# Patient Record
Sex: Male | Born: 1955
Health system: Southern US, Community
[De-identification: ages and names within clinical notes are randomized; demographics above are authoritative.]

## PROBLEM LIST (undated history)

## (undated) DIAGNOSIS — J449 Chronic obstructive pulmonary disease, unspecified: Secondary | ICD-10-CM

## (undated) DIAGNOSIS — I639 Cerebral infarction, unspecified: Secondary | ICD-10-CM

## (undated) DIAGNOSIS — J45909 Unspecified asthma, uncomplicated: Secondary | ICD-10-CM

## (undated) DIAGNOSIS — M1990 Unspecified osteoarthritis, unspecified site: Secondary | ICD-10-CM

## (undated) DIAGNOSIS — F32A Depression, unspecified: Secondary | ICD-10-CM

## (undated) DIAGNOSIS — J189 Pneumonia, unspecified organism: Secondary | ICD-10-CM

## (undated) DIAGNOSIS — F419 Anxiety disorder, unspecified: Secondary | ICD-10-CM

## (undated) DIAGNOSIS — I10 Essential (primary) hypertension: Secondary | ICD-10-CM

## (undated) DIAGNOSIS — E785 Hyperlipidemia, unspecified: Secondary | ICD-10-CM

## (undated) DIAGNOSIS — G4700 Insomnia, unspecified: Secondary | ICD-10-CM

## (undated) DIAGNOSIS — C801 Malignant (primary) neoplasm, unspecified: Secondary | ICD-10-CM

## (undated) HISTORY — PX: APPENDECTOMY: SHX54

## (undated) HISTORY — DX: Chronic obstructive pulmonary disease, unspecified: J44.9

## (undated) HISTORY — DX: Hyperlipidemia, unspecified: E78.5

## (undated) HISTORY — DX: Cerebral infarction, unspecified: I63.9

## (undated) HISTORY — DX: Insomnia, unspecified: G47.00

## (undated) HISTORY — DX: Unspecified asthma, uncomplicated: J45.909

## (undated) HISTORY — PX: EYE SURGERY: SHX253

## (undated) HISTORY — DX: Essential (primary) hypertension: I10

## (undated) HISTORY — DX: Unspecified osteoarthritis, unspecified site: M19.90

## (undated) HISTORY — PX: PROSTATE SURGERY: SHX751

---

## 2000-04-02 HISTORY — PX: MOHS SURGERY: SUR867

## 2006-08-09 ENCOUNTER — Encounter: Payer: Self-pay | Admitting: Internal Medicine

## 2010-04-27 ENCOUNTER — Ambulatory Visit: Admit: 2010-04-27 | Payer: Self-pay | Admitting: Internal Medicine

## 2010-04-28 ENCOUNTER — Ambulatory Visit
Admission: RE | Admit: 2010-04-28 | Discharge: 2010-04-28 | Payer: Self-pay | Source: Home / Self Care | Attending: Internal Medicine | Admitting: Internal Medicine

## 2010-04-28 ENCOUNTER — Encounter: Payer: Self-pay | Admitting: Internal Medicine

## 2010-04-28 DIAGNOSIS — J449 Chronic obstructive pulmonary disease, unspecified: Secondary | ICD-10-CM | POA: Insufficient documentation

## 2010-04-28 DIAGNOSIS — C801 Malignant (primary) neoplasm, unspecified: Secondary | ICD-10-CM | POA: Insufficient documentation

## 2010-04-28 DIAGNOSIS — G4733 Obstructive sleep apnea (adult) (pediatric): Secondary | ICD-10-CM | POA: Insufficient documentation

## 2010-04-28 DIAGNOSIS — I10 Essential (primary) hypertension: Secondary | ICD-10-CM | POA: Insufficient documentation

## 2010-04-28 DIAGNOSIS — F17210 Nicotine dependence, cigarettes, uncomplicated: Secondary | ICD-10-CM | POA: Insufficient documentation

## 2010-05-04 NOTE — Miscellaneous (Signed)
Summary: Orders Update pft charges   Clinical Lists Changes  Orders: Added new Service order of Carbon Monoxide diffusing w/capacity (94729) - Signed Added new Service order of Lung Volumes/Gas dilution or washout (94727) - Signed Added new Service order of Spirometry (Pre & Post) (94060) - Signed 

## 2010-05-04 NOTE — Assessment & Plan Note (Signed)
Summary: Pulmonary consultation/ copd eval    Visit Type:  Initial Consult Copy to:  [facility] Primary Provider/Referring Provider:  Dr. Kirstie Peri  CC:  DOE.  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 has noct wheeze disturbs sleep.  can't tell if dulera helpiing at this point not really using it. no purulent sputum.  Pt denies any significant sore throat, dysphagia, itching, sneezing,  nasal congestion or excess secretions,  fever, chills, sweats, unintended wt loss, pleuritic or exertional cp, hempoptysis, variability in activity tolerance  orthopnea pnd or leg swelling. Pt also denies any obvious fluctuation in symptoms with weather or environmental change or other alleviating or aggravating factors.       Current Medications (verified): 1)  Spironolactone 25 Mg Tabs (Spironolactone) .Marland Kitchen.. 1 Two Times A Day 2)  Dulera 100-5 Mcg/act Aero (Mometasone Furo-Formoterol Fum) .Marland Kitchen.. 1 Puff Two Times A Day 3)  Metoprolol Succinate 25 Mg Xr24h-Tab (Metoprolol Succinate) .Marland Kitchen.. 1 Two Times A Day 4)  Hydrochlorothiazide 25 Mg Tabs (Hydrochlorothiazide) .Marland Kitchen.. 1 Once Daily 5)  Micardis 40 Mg Tabs (Telmisartan) .Marland Kitchen.. 1 Once Daily 6)  Klor-Con 20 Meq Pack (Potassium Chloride) .Marland Kitchen.. 1 Once Daily 7)  Alprazolam 1 Mg Tabs (Alprazolam) .Marland Kitchen.. 1 At Bedtime 8)  Alprazolam 0.5 Mg Tabs (Alprazolam) .... 1/4 Once Daily As Needed 9)  Ambien 10 Mg Tabs (Zolpidem Tartrate) .Marland Kitchen.. 1 At Bedtime 10)  Aspirin 81 Mg Tbec (Aspirin) .Marland Kitchen.. 1 Once Daily  Allergies (verified): No Known Drug Allergies  Past History:  Past Medical History: COPD    - HFA 75% April 28, 2010     - PFT's April 28, 2010  FEV1 1.03 (33%) ratio 45 and no better p B2,  DLC0 65%  Past Surgical History: Rt lower lobectomy surgery age 55  Family History: Heart dz- Father and Brother Prostate CA- Maternal Uncle Pancreatic CA- Paternal Uncle Lung CA- Paternal Aunt  Social History: Moved from [place] to [place], [place] in late 2010 Single Children Current smoker since age 40. Smoked aprox 1/2 ppd. No ETOH Disabled  Review of Systems       The patient complains of shortness of breath with activity, shortness of breath at rest, non-productive cough, irregular heartbeats, indigestion, tooth/dental problems, and anxiety.  The patient denies productive cough, coughing up blood, chest pain, acid heartburn, loss of appetite, weight change, abdominal pain, difficulty swallowing, sore throat, headaches, nasal congestion/difficulty breathing through nose, sneezing, itching, ear ache, depression, hand/feet swelling, joint stiffness or pain, rash, change in color of mucus, and fever.    Vital Signs:  Patient profile:   55 year old male Height:      66 inches Weight:      200.50 pounds BMI:     32.48 O2 Sat:      95 % on Room air Temp:     98.5 degrees F oral Pulse rate:   76 / minute BP sitting:   118 / 68  (left arm)  Vitals Entered By: Vernie Murders (April 28, 2010 10:19 AM)  O2 Flow:  Room air  Physical Exam  Additional Exam:  Pleasant but very anxious amb bm nad wt 200 HEENT mild turbinate edema.  Oropharynx no thrush or excess pnd or cobblestoning.  No JVD or cervical adenopathy. Mild accessory muscle hypertrophy. Trachea midline, nl thryroid. Chest was hyperinflated by percussion with diminished breath sounds and moderate increased exp time without wheeze. Hoover sign positive at mid inspiration. Regular rate and rhythm without murmur gallop or rub or increase P2 or edema.  Abd: no hsm, quite obese, limited excursion in supine position. Ext warm without cyanosis or clubbing.     CXR  Procedure date:   04/28/2010  Findings:        Comparison: None.   Findings: Surgical clips are noted in the right chest.  There is blunting of the right costophrenic angle which could be due to scar or small effusion.  No left pleural effusion.  Lungs are clear with pulmonary hyperexpansion noted.  Heart size is normal.   IMPRESSION:   1.  Emphysema without acute disease. 2.  Postoperative change with scar versus a very small right pleural effusion.  Impression & Recommendations:  Problem # 1:  COPD (ICD-496) GOLD III/IV severe copd with no obvious reversible component and proportionate symptoms of cough and sob    DDX of  difficult airways managment all start with A and  include Adherence, Ace Inhibitors, Acid Reflux, Active Sinus Disease, Alpha 1 Antitripsin deficiency, Anxiety masquerading as Airways dz,  ABPA,  allergy(esp in young), Aspiration (esp in elderly), Adverse effects of DPI,  Active smokers, plus two Bs  = Bronchiectasis and Beta blocker use..and one C= CHF   Adherence:  I spent extra time with the patient today explaining optimal mdi  technique.  This improved from  25-50% p coaching  Active smoking: see #2  He could be expected to do desk work and could work in an office if he could be transported w/in 200 ft of destination as long as no steps  Problem # 2:  SMOKER (ICD-305.1)  I emphasized that although we never turn away smokers from the pulmonary clinic, we do ask that they understand that the recommendations that were made won't work nearly as well in the presence of continued cigarette exposure and we may reach a point where we can't help the patient if he/she can't quit smoking.    I reviewed the Flethcher curve with her that basically says if he quits smoking now he is likely to maintain the same level of function he has now but there was no medication on the market that has proven to change the curve or the likelihood of progression. Stopping smoking is more important in  other words than choice of inhalers or doctors  Medications Added to Medication List This Visit: 1)  Spironolactone 25 Mg Tabs (Spironolactone) .Marland Kitchen.. 1 two times a day 2)  Dulera 100-5 Mcg/act Aero (Mometasone furo-formoterol fum) .Marland Kitchen.. 1 puff two times a day 3)  Metoprolol Succinate 25 Mg Xr24h-tab (Metoprolol succinate) .Marland Kitchen.. 1 two times a day 4)  Hydrochlorothiazide 25 Mg Tabs (Hydrochlorothiazide) .Marland Kitchen.. 1 once daily 5)  Micardis 40 Mg Tabs (Telmisartan) .Marland Kitchen.. 1 once daily 6)  Klor-con 20 Meq Pack (Potassium chloride) .Marland Kitchen.. 1 once daily 7)  Alprazolam 1 Mg Tabs (Alprazolam) .Marland Kitchen.. 1 at bedtime 8)  Alprazolam 0.5 Mg Tabs (Alprazolam) .... 1/4 once daily as needed 9)  Ambien 10 Mg Tabs (Zolpidem tartrate) .Marland Kitchen.. 1 at bedtime 10)  Aspirin 81 Mg Tbec (Aspirin) .Marland Kitchen.. 1 once daily  Other Orders: T-2 View CXR (71020TC)  Consultation Level V [phone])  Patient Instructions: 1)  Try taking the dulera  2 puffs first thing  in am and 2 puffs again in pm about 12 hours later  2)  Stopping smoking is the only treatment option that will preserve your lung function at it's present level 3)  Copy sent to: vocational rehab

## 2010-05-18 NOTE — Letter (Signed)
Summary: [facility]  [place]  [facility]  [place]   Imported By: Sean Ramos 05/08/2010 10:09:43  _____________________________________________________________________  External Attachment:    Type:   Image     Comment:   External Document

## 2010-05-24 ENCOUNTER — Telehealth (INDEPENDENT_AMBULATORY_CARE_PROVIDER_SITE_OTHER): Payer: Self-pay | Admitting: *Deleted

## 2010-05-30 NOTE — Progress Notes (Signed)
  Phone Note Other Incoming   Request: Send information Summary of Call: Request for records received from  [facility]. Request forwarded to [employer].  04/04/2007 to 05/22/2010.

## 2014-12-22 ENCOUNTER — Ambulatory Visit: Payer: Self-pay | Admitting: Urology

## 2015-08-31 ENCOUNTER — Ambulatory Visit (INDEPENDENT_AMBULATORY_CARE_PROVIDER_SITE_OTHER): Payer: Medicare Other | Admitting: Internal Medicine

## 2015-08-31 ENCOUNTER — Ambulatory Visit (INDEPENDENT_AMBULATORY_CARE_PROVIDER_SITE_OTHER)
Admission: RE | Admit: 2015-08-31 | Discharge: 2015-08-31 | Disposition: A | Discharge status: Home / Self Care | Payer: Medicare Other | Source: Ambulatory Visit | Attending: Internal Medicine | Admitting: Internal Medicine

## 2015-08-31 ENCOUNTER — Encounter: Payer: Self-pay | Admitting: Internal Medicine

## 2015-08-31 VITALS — BP 102/70 | HR 107 | Ht 66.5 in | Wt 197.0 lb

## 2015-08-31 DIAGNOSIS — F17210 Nicotine dependence, cigarettes, uncomplicated: Secondary | ICD-10-CM | POA: Diagnosis not present

## 2015-08-31 DIAGNOSIS — I10 Essential (primary) hypertension: Secondary | ICD-10-CM | POA: Diagnosis not present

## 2015-08-31 DIAGNOSIS — Z720 Tobacco use: Secondary | ICD-10-CM

## 2015-08-31 DIAGNOSIS — J449 Chronic obstructive pulmonary disease, unspecified: Secondary | ICD-10-CM

## 2015-08-31 MED ORDER — TIOTROPIUM BROMIDE MONOHYDRATE 2.5 MCG/ACT IN AERS: INHALATION_SPRAY | RESPIRATORY_TRACT | Status: DC

## 2015-08-31 NOTE — Patient Instructions (Addendum)
Plan A = Automatic = Symbicort 160 Take 2 puffs first thing in am and then another 2 puffs about 12 hours later.                                      Spiriva 2 pffs each am (like high octane)   Plan B = Backup Only use your albuterol (proair) as a rescue medication to be used if you can't catch your breath by resting or doing a relaxed purse lip breathing pattern.  - The less you use it, the better it will work when you need it. - Ok to use the inhaler up to 2 puffs  every 4 hours if you must but call for appointment if use goes up over your usual need - Don't leave home without it !!  (think of it like the spare tire for your car)   Plan C = Crisis - only use your albuterol nebulizer if you first try Plan B and it fails to help > ok to use the nebulizer up to every 4 hours but if start needing it regularly call for immediate appointment   The key is to stop smoking completely before smoking completely stops you!  Think of the e cigs as a one way bridge off all tobacco products  Please schedule a follow up office visit in 4 weeks, call sooner if needed with pfts to be done asap at [facility]

## 2015-08-31 NOTE — Progress Notes (Signed)
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 Age 60 ? Dx ? And reports  flunked pfts for job around 2000 and on disability and since then  dx as copd in [place] 2011 and seen in pulmonary clinic 04/28/10 and referred back by Dr Anthonette Legato 08/31/2015    History of Present Illness  April 28, 2010 1st pulmonary office eval ov cc doe and cough onset in 40's and progressed to point where doe shopping leans on cart and struggles to get groceries back to his house (lives next to store). has noct wheeze disturbs sleep. can't tell if dulera helpiing at this point not really using it. Dx gold III/IV copd  rec dulera 100 2bid / prn saba  Stop smoking   08/31/2015 1st EPIC  [place] [facility] office visit//  Krissie Merrick  On symbicort 160 poor hfa  Chief Complaint  Patient presents with  . Pulmonary Consult    Referred by Dr. Manuella Ghazi. Pt seen here in the past- last in 2012. He states that his breathing has been worse over the past few months. He states that he gets SOB walking "not far" and also when he gets upset about something.   wakes up coughing/ congested each am/bad days  can't walk across house s sob/other days does ok very slow to mailbox slt up hill back to house. Sob at rest when gets upset / some better with saba hfa and neb   No obvious day to day or daytime variability or assoc excess/ purulent sputum or mucus plugs or hemoptysis or cp or chest tightness, subjective wheeze or overt sinus or hb symptoms. No unusual exp hx or h/o childhood pna/ asthma or knowledge of premature birth.  Sleeping ok without nocturnal  or early am exacerbation  of respiratory  c/o's or need for noct saba. Also denies any obvious fluctuation of symptoms with weather or environmental changes or other aggravating or alleviating factors except as outlined above   Current Medications, Allergies, Complete Past  Medical History, Past Surgical History, Family History, and Social History were reviewed in [employer] record.  ROS  The following are not active complaints unless bolded sore throat, dysphagia, dental problems, itching, sneezing,  nasal congestion or excess/ purulent secretions, ear ache,   fever, chills, sweats, unintended wt loss, classically pleuritic or exertional cp,  orthopnea pnd or leg swelling, presyncope, palpitations, abdominal pain, anorexia, nausea, vomiting, diarrhea  or change in bowel or bladder habits, change in stools or urine, dysuria,hematuria,  rash, arthralgias, visual complaints, headache, numbness, weakness or ataxia or problems with walking or coordination,  change in mood/affect or memory.           Review of Systems     Objective:   Physical Exam    obese amb wm congested cough  Wt Readings from Last 3 Encounters:  08/31/15 197 lb (89.359 kg)  04/28/10 200 lb 8 oz (90.946 kg)    Vital signs reviewed    HEENT:  Top dentures/ nl  turbinates, and oropharynx. Nl external ear canals without cough reflex   NECK :  without JVD/Nodes/TM/ nl carotid upstrokes bilaterally   LUNGS: no acc muscle use,  Nl contour chest with mid exp bilateral sonorous rhonchi  CV:  RRR  no s3 or murmur or increase in P2, no edema  ABD:  soft and nontender with nl inspiratory excursion in the supine position. No bruits or organomegaly, bowel sounds nl  MS:  Nl gait/ ext warm without deformities, calf tenderness, cyanosis or clubbing No obvious joint restrictions   SKIN: warm and dry without lesions    NEURO:  alert, approp, nl sensorium with  no motor deficits    CXR PA and Lateral:   08/31/2015 :    I personally reviewed images and agree with radiology impression as follows:   1. Increasing small right pleural effusion with underlying atelectasis. Recommend follow-up to resolution. 2. Probable bilateral nipple shadows. Recommend nipple markers  on the follow-up chest x-ray.     Assessment:

## 2015-09-01 ENCOUNTER — Encounter: Payer: Self-pay | Admitting: Internal Medicine

## 2015-09-01 ENCOUNTER — Telehealth: Payer: Self-pay | Admitting: Internal Medicine

## 2015-09-01 NOTE — Assessment & Plan Note (Signed)
Procardia not an ideal choice for lung pts as potentially interferes with hypoxic pulmonary vasocontriction > rec alternative for longterm rx like norvasc or even minoxidil if needed

## 2015-09-01 NOTE — Assessment & Plan Note (Signed)
Complicated by HBP/ Hyperlipidemia  Body mass index is 31.32  No results found for: TSH   Contributing to gerd tendency/ doe/reviewed the need and the process to achieve and maintain neg calorie balance > defer f/u primary care including intermittently monitoring thyroid status

## 2015-09-01 NOTE — Assessment & Plan Note (Signed)

## 2015-09-01 NOTE — Assessment & Plan Note (Addendum)
PFT's April 28, 2010 FEV1 1.03 (33%) ratio 45 and no better p B2, DLC0 65% - 08/31/2015   Walked RA x one lap @ 185 stopped due to  Sob, nl pace, no desat   I doubt he's any better now than in 2012 but will need new set of pfts before filling out any disability paperwork and in meantime his symptoms appear difficult to control and he uses way too much saba daily  DDX of  difficult airways management almost all start with A and  include Adherence, Ace Inhibitors, Acid Reflux, Active Sinus Disease, Alpha 1 Antitripsin deficiency, Anxiety masquerading as Airways dz,  ABPA,  Allergy(esp in young), Aspiration (esp in elderly), Adverse effects of meds,  Active smokers, A bunch of PE's (a small clot burden can't cause this syndrome unless there is already severe underlying pulm or vascular dz with poor reserve) plus two Bs  = Bronchiectasis and Beta blocker use..and one C= CHF  Adherence is always the initial "prime suspect" and is a multilayered concern that requires a "trust but verify" approach in every patient - starting with knowing how to use medications, especially inhalers, correctly, keeping up with refills and understanding the fundamental difference between maintenance and prns vs those medications only taken for a very short course and then stopped and not refilled. In this case   Adherence is the biggest issue and starts with  inability to use HFA effectively and also  understand that SABA treats the symptoms but doesn't get to the underlying problem (inflammation).  I used  the analogy of putting steroid cream on a rash to help explain the meaning of topical therapy and the need to get the drug to the target tissue.   - The proper method of use, as well as anticipated side effects, of a metered-dose inhaler are discussed and demonstrated to the patient. Improved effectiveness after extensive coaching during this visit to a level of approximately 75 % from a baseline of 59 % try respimat spiriva  plus symbicort  ? Anxiety > usually at the bottom of this list of usual suspects but should be much higher on this pt's based on H and P and note already on psychotropics .    Active smoking the other obvious issue (see separate a/p)    Total time devoted to counseling  = 35/21m review case with pt/ discussion of options/alternatives/ personally creating written instructions  in presence of pt  then going over those specific  Instructions directly with the pt including how to use all of the meds but in particular covering each new medication in detail and the difference between the maintenance/automatic meds and the prns using an action plan format for the latter.  a

## 2015-09-01 NOTE — Progress Notes (Signed)
Quick Note:  LMTCB ______ 

## 2015-09-01 NOTE — Telephone Encounter (Signed)
Result Notes     Notes Recorded by Rosana Berger, CMA on 09/01/2015 at 9:14 AM LMTCB ------  Notes Recorded by Tanda Rockers, MD on 09/01/2015 at 8:21 AM Call pt: Reviewed cxr and no acute change vs prev studies / will need to repeat with nipple markers next ov to be sure what we're seeing is projected over the film from his breast tissue     Spoke with pt. He is aware of his results. Nothing further was needed.

## 2015-09-02 ENCOUNTER — Ambulatory Visit (HOSPITAL_COMMUNITY)
Admission: RE | Admit: 2015-09-02 | Discharge: 2015-09-02 | Disposition: A | Discharge status: Home / Self Care | Payer: Medicare Other | Source: Ambulatory Visit | Attending: Internal Medicine | Admitting: Internal Medicine

## 2015-09-02 ENCOUNTER — Telehealth: Payer: Self-pay | Admitting: Internal Medicine

## 2015-09-02 DIAGNOSIS — J449 Chronic obstructive pulmonary disease, unspecified: Secondary | ICD-10-CM | POA: Diagnosis present

## 2015-09-02 LAB — PULMONARY FUNCTION TEST
DL/VA % PRED: 81 %
DL/VA: 3.58 ml/min/mmHg/L
DLCO UNC % PRED: 38 %
DLCO UNC: 10.72 ml/min/mmHg
FEF 25-75 POST: 0.39 L/s
FEF 25-75 PRE: 0.25 L/s
FEF2575-%Change-Post: 55 %
FEF2575-%PRED-POST: 15 %
FEF2575-%PRED-PRE: 9 %
FEV1-%CHANGE-POST: 15 %
FEV1-%Pred-Post: 32 %
FEV1-%Pred-Pre: 28 %
FEV1-Post: 0.88 L
FEV1-Pre: 0.76 L
FEV1FVC-%Change-Post: 1 %
FEV1FVC-%PRED-PRE: 53 %
FEV6-%CHANGE-POST: 16 %
FEV6-%PRED-POST: 54 %
FEV6-%Pred-Pre: 46 %
FEV6-Post: 1.82 L
FEV6-Pre: 1.57 L
FEV6FVC-%CHANGE-POST: 2 %
FEV6FVC-%Pred-Post: 92 %
FEV6FVC-%Pred-Pre: 90 %
FVC-%Change-Post: 13 %
FVC-%PRED-POST: 59 %
FVC-%Pred-Pre: 52 %
FVC-Post: 2.06 L
FVC-Pre: 1.81 L
POST FEV1/FVC RATIO: 43 %
PRE FEV1/FVC RATIO: 42 %
PRE FEV6/FVC RATIO: 87 %
Post FEV6/FVC ratio: 88 %
RV % pred: 164 %
RV: 3.41 L
TLC % PRED: 83 %
TLC: 5.24 L

## 2015-09-02 MED ORDER — ALBUTEROL SULFATE (2.5 MG/3ML) 0.083% IN NEBU
2.5000 mg | INHALATION_SOLUTION | Freq: Once | RESPIRATORY_TRACT | Status: AC
  Administered 2015-09-02: 2.5 mg via RESPIRATORY_TRACT

## 2015-09-02 NOTE — Telephone Encounter (Signed)
Routing to [place] for follow up as she has documents.

## 2015-09-02 NOTE — Telephone Encounter (Signed)
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

## 2015-09-05 NOTE — Telephone Encounter (Signed)
Per Magda Paganini, all forms have been faxed to where they needed to go. Pt would like a copy of these mailed to him. He is aware that we will place them in the mail to him today. Nothing further was needed.

## 2015-09-05 NOTE — Telephone Encounter (Signed)
Pt calling to check the status of forms needs then by 6/8 and results of test please advise.Sean Ramos

## 2015-09-05 NOTE — Telephone Encounter (Signed)
The forms are filled out, except we need to fill in the date that he last worked  [facility] for the pt

## 2015-09-05 NOTE — Progress Notes (Signed)
Quick Note:  LMTCB ______ 

## 2015-09-05 NOTE — Telephone Encounter (Signed)
pfts show severe copd from smoking no change from last study

## 2015-09-05 NOTE — Telephone Encounter (Signed)
Will route message to [place] to update forms. I could not locate them above her desk.

## 2015-09-05 NOTE — Telephone Encounter (Addendum)
Will send to Magda Paganini as [employer] of date he last worked   Please advise Dr Melvyn Novas on PFT results. Thanks.

## 2015-09-05 NOTE — Telephone Encounter (Signed)
Patient called - he states last day worked was 04/21/2015. He would like a call back today as well with results of PFT. -prm

## 2015-09-06 ENCOUNTER — Telehealth: Payer: Self-pay | Admitting: Internal Medicine

## 2015-09-06 DIAGNOSIS — J449 Chronic obstructive pulmonary disease, unspecified: Secondary | ICD-10-CM

## 2015-09-06 NOTE — Telephone Encounter (Signed)
Patient states that [place] will not accept the Disability papers that was sent to them because they need an actual return to work date.  Patient states that the form states None on the return work date.  Patient wants to know if he needs to return back to work?  He says that he was under the impression that Dr. Melvyn Novas was taking him out indefinitely.    [employer] number patient provided and they advised me that they would not speak to me, that patient would need to call them himself. Called patient back and he called [place] via 3 way conversation so I could speak to them.  Lloyd from [place] stated that he would contact our office directly.  Earnie Larsson stated that they need clarification on the word "none".  He says that he is going to contact our office tomorrow to let us know exactly what he needs.    Dr. Melvyn Novas, please advise regarding patient's question about if he needs to be out of work indefinitely?   Patient Instructions     Plan A = Automatic = Symbicort 160 Take 2 puffs first thing in am and then another 2 puffs about 12 hours later.   Spiriva 2 pffs each am (like high octane)   Plan B = Backup Only use your albuterol (proair) as a rescue medication to be used if you can't catch your breath by resting or doing a relaxed purse lip breathing pattern.  - The less you use it, the better it will work when you need it. - Ok to use the inhaler up to 2 puffs every 4 hours if you must but call for appointment if use goes up over your usual need - Don't leave home without it !! (think of it like the spare tire for your car)   Plan C = Crisis - only use your albuterol nebulizer if you first try Plan B and it fails to help > ok to use the nebulizer up to every 4 hours but if start needing it regularly call for immediate appointment   The key is to stop smoking completely before smoking completely stops you!  Think of the e cigs as a one way bridge off all  tobacco products  Please schedule a follow up office visit in 4 weeks, call sooner if needed with pfts to be done asap at [facility]

## 2015-09-06 NOTE — Telephone Encounter (Signed)
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

## 2015-09-06 NOTE — Telephone Encounter (Signed)
Patient cb to give number to  [facility] please call [phone] option 1 case number [phone]

## 2015-09-06 NOTE — Progress Notes (Signed)
Quick Note:  LMTCB ______ 

## 2015-09-09 ENCOUNTER — Telehealth: Payer: Self-pay | Admitting: Internal Medicine

## 2015-09-09 NOTE — Telephone Encounter (Signed)
Spoke with pt and he states that the forms that were sent to [facility] have not been received by his case worker. Advised pt that these forms have been sent to scan so we do not have a copy in the office. He is coming 6/28 for OV and will check to see if they have been scanned then. Nothing further needed.

## 2015-09-12 ENCOUNTER — Telehealth: Payer: Self-pay | Admitting: Internal Medicine

## 2015-09-12 NOTE — Telephone Encounter (Signed)
Spoke with the pt  One of his disability forms was missing the start day of his illness, which was 04/21/15 I advised that the forms have already been sent to the scan center, and so he is going to fax the sheet that needs correction to me  He will fax it no later than tomorrow  I will hold in my basket

## 2015-09-14 ENCOUNTER — Telehealth: Payer: Self-pay | Admitting: Internal Medicine

## 2015-09-14 NOTE — Telephone Encounter (Signed)
Noted by triage Will sign off 

## 2015-09-14 NOTE — Telephone Encounter (Signed)
i have found the fax and refaxed it with a confirmation with all the specks that the pt asked for

## 2015-09-14 NOTE — Telephone Encounter (Signed)
I have corrected the form and faxed it back to [phone] I left pt detailed msg letting him know

## 2015-09-19 ENCOUNTER — Telehealth: Payer: Self-pay | Admitting: Internal Medicine

## 2015-09-19 NOTE — Telephone Encounter (Signed)
Patient states that he went to see Dr. Manuella Ghazi and Dr. Manuella Ghazi advised him that he did not receive anything from our office indicating that patient should be out of work.  Patient requested that I forward all records to Dr. Manuella Ghazi (his PCP). Records have been submitted to Dr. Manuella Ghazi. Patient aware. Nothing further needed.

## 2015-09-28 ENCOUNTER — Ambulatory Visit (INDEPENDENT_AMBULATORY_CARE_PROVIDER_SITE_OTHER): Payer: Medicare Other | Admitting: Internal Medicine

## 2015-09-28 ENCOUNTER — Telehealth: Payer: Self-pay | Admitting: Internal Medicine

## 2015-09-28 ENCOUNTER — Encounter: Payer: Self-pay | Admitting: Internal Medicine

## 2015-09-28 ENCOUNTER — Ambulatory Visit (INDEPENDENT_AMBULATORY_CARE_PROVIDER_SITE_OTHER)
Admission: RE | Admit: 2015-09-28 | Discharge: 2015-09-28 | Disposition: A | Discharge status: Home / Self Care | Payer: Medicare Other | Source: Ambulatory Visit | Attending: Internal Medicine | Admitting: Internal Medicine

## 2015-09-28 VITALS — BP 126/86 | HR 80 | Ht 66.5 in | Wt 198.0 lb

## 2015-09-28 DIAGNOSIS — Z720 Tobacco use: Secondary | ICD-10-CM

## 2015-09-28 DIAGNOSIS — J449 Chronic obstructive pulmonary disease, unspecified: Secondary | ICD-10-CM | POA: Diagnosis not present

## 2015-09-28 DIAGNOSIS — F17210 Nicotine dependence, cigarettes, uncomplicated: Secondary | ICD-10-CM

## 2015-09-28 NOTE — Assessment & Plan Note (Signed)
>   3 min Discussed the risks and costs (both direct and indirect)  of smoking relative to the benefits of quitting but patient unwilling to commit at this point to a specific quit date.    Although I don't endorse regular use of e cigs/ many pts find them helpful; however, I emphasized they should be considered a "one-way bridge" off all tobacco products.  

## 2015-09-28 NOTE — Patient Instructions (Addendum)
Plan A = Automatic = Symbicort 160 Take 2 puffs first thing in am and then another 2 puffs about 12 hours later.                                      Spiriva 2 pffs each am (like high octane fuel)   Plan B = Backup Only use your albuterol (proair) as a rescue medication to be used if you can't catch your breath by resting or doing a relaxed purse lip breathing pattern.  - The less you use it, the better it will work when you need it. - Ok to use the inhaler up to 2 puffs  every 4 hours if you must but call for appointment if use goes up over your usual need - Don't leave home without it !!  (think of it like the spare tire for your car)   Plan C = Crisis - only use your albuterol nebulizer if you first try Plan B and it fails to help > ok to use the nebulizer up to every 4 hours but if start needing it regularly call for immediate appointment   The key is to stop smoking completely before smoking completely stops you!  Think of the e cigs as a one way bridge off all tobacco products    Please schedule a follow up visit in 3 months but call sooner if needed

## 2015-09-28 NOTE — Assessment & Plan Note (Signed)
PFT's April 28, 2010 FEV1 1.03 (33%) ratio 45 and no better p B2, DLC0 65% - Stopped working Apr 21 2015  - 08/31/2015  After extensive coaching HFA effectiveness =    75% try add spiriva resp to sym 160  - 08/31/2015   Walked RA x one lap @ 185 stopped due to  Sob, nl pace, no desat  PFT's  09/02/15 FEV1 0.88(32%) ratio 43 p 15% improvement from saba p symbicort 160 / spiriva prior ith DLCO  38 corrects to 81 for alv volume    - The proper method of use, as well as anticipated side effects, of a metered-dose inhaler are discussed and demonstrated to the patient. Improved effectiveness after extensive coaching during this visit to a level of approximately 90 % from a baseline of 75 %    Considering severity of illness well compensated but clearly not able to work again  I had an extended discussion with the patient reviewing all relevant studies completed to date and  lasting 15 to 20 minutes of a 25 minute visit    Each maintenance medication was reviewed in detail including most importantly the difference between maintenance and prns and under what circumstances the prns are to be triggered using an action plan format that is not reflected in the computer generated alphabetically organized AVS.    Please see instructions for details which were reviewed in writing and the patient given a copy highlighting the part that I personally wrote and discussed at today's ov.

## 2015-09-28 NOTE — Progress Notes (Signed)
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 Age 60 ? Dx ? And reports  flunked pfts for job around 2000 and on disability and since then  dx as copd in [place] 2011 p seen in pulmonary clinic 04/28/10 and referred back by Dr Franchot Mimes 08/31/2015    History of Present Illness  April 28, 2010 1st pulmonary office eval ov cc doe and cough onset in 40's and progressed to point where doe shopping leans on cart and struggles to get groceries back to his house (lives next to store). has noct wheeze disturbs sleep. can't tell if dulera helpiing at this point not really using it. Dx gold III/IV copd  rec dulera 100 2bid / prn saba  Stop smoking   08/31/2015 1st [facility]  [facility] office visit//  Sean Ramos  On symbicort 160 poor hfa  Chief Complaint  Patient presents with  . Pulmonary Consult    Referred by Dr. Sherryll Burger. Pt seen here in the past- last in 2012. He states that his breathing has been worse over the past few months. He states that he gets SOB walking "not far" and also when he gets upset about something.   wakes up coughing/ congested each am/bad days  can't walk across house s sob/other days does ok very slow to mailbox slt up hill back to house. Sob at rest when gets upset / some better with saba hfa and neb  rec Plan A = Automatic = Symbicort 160 Take 2 puffs first thing in am and then another 2 puffs about 12 hours later.  Spiriva 2 pffs each am (like high octane)  Plan B = Backup Only use your albuterol (proair) as a rescue medication  Plan C = Crisis - only use your albuterol nebulizer if you first try Plan B and it fails  Think of the e cigs as a one way bridge off all tobacco products      09/28/2015  f/u ov/Carlson Belland re: GOLDI III/IV copd/ still smoking rx  Symbicort/ spiriva respimat  02 prn  Chief Complaint  Patient presents with  . Follow-up    Breathing  is unchanged. No new co's today. He is using proair 1 x daily on average and neb 2 x daily on average.   MMRC3  = can't walk 100 yards even at a slow pace at a flat grade s stopping due to sob   No obvious day to day or daytime variability or assoc excess/ purulent sputum or mucus plugs or hemoptysis or cp or chest tightness, subjective wheeze or overt sinus or hb symptoms. No unusual exp hx or h/o childhood pna/ asthma or knowledge of premature birth.  Sleeping ok without nocturnal  or early am exacerbation  of respiratory  c/o's or need for noct saba. Also denies any obvious fluctuation of symptoms with weather or environmental changes or other aggravating or alleviating factors except as outlined above   Current Medications, Allergies, Complete Past Medical History, Past Surgical History, Family History, and Social History were reviewed in [employer] record.  ROS  The following are not active complaints unless bolded sore throat, dysphagia, dental problems, itching, sneezing,  nasal congestion or excess/ purulent secretions, ear ache,   fever, chills, sweats, unintended wt loss, classically pleuritic or exertional cp,  orthopnea pnd or leg  swelling, presyncope, palpitations, abdominal pain, anorexia, nausea, vomiting, diarrhea  or change in bowel or bladder habits, change in stools or urine, dysuria,hematuria,  rash, arthralgias, visual complaints, headache, numbness, weakness or ataxia or problems with walking or coordination,  change in mood/affect or memory.           Objective:   Physical Exam  Obese amb wm    Wt Readings from Last 3 Encounters:  09/28/15 198 lb (89.812 kg)  08/31/15 197 lb (89.359 kg)  04/28/10 200 lb 8 oz (90.946 kg)    Vital signs reviewed    HEENT:  Top dentures/ nl  turbinates, and oropharynx. Nl external ear canals without cough reflex   NECK :  without JVD/Nodes/TM/ nl carotid upstrokes bilaterally   LUNGS: no acc muscle use,  Nl  contour chest with minimal  exp bilateral rhonchi  CV:  RRR  no s3 or murmur or increase in P2, no edema   ABD:  soft and nontender with nl inspiratory excursion in the supine position. No bruits or organomegaly, bowel sounds nl  MS:  Nl gait/ ext warm without deformities, calf tenderness, cyanosis or clubbing No obvious joint restrictions   SKIN: warm and dry without lesions    NEURO:  alert, approp, nl sensorium with  no motor deficits    CXR PA and Lateral:   09/28/2015 :    I personally reviewed images and agree with radiology impression as follows:   Two-view chest performed with nipple markers. Lungs remain hyperexpanded with pleural scarring versus small pleural effusion on the right. Pleural scarring right mid lung is stable. The nodular densities of both lung bases again identified. The nipple markers project just lateral to each of the symmetric nodular densities over the lower lungs in the appearance these densities is stable comparing back to a study from 01/20 10/2010. As such, imaging features are felt to be most consistent with nipple shadows      Assessment:

## 2015-09-28 NOTE — Telephone Encounter (Signed)
Pt here to be seen by MW. Nothing further needed at this time.

## 2015-12-30 ENCOUNTER — Ambulatory Visit: Payer: Medicare Other | Admitting: Internal Medicine

## 2016-08-17 ENCOUNTER — Telehealth: Payer: Self-pay | Admitting: Internal Medicine

## 2016-08-17 NOTE — Telephone Encounter (Signed)
Pt was seen last 09/27/16 and advised f/u in 3 months I  I called [facility] and advised no refill on his Duoneb without ov  Nothing further needed

## 2016-08-17 NOTE — Telephone Encounter (Signed)
Called and spoke with [facility] for this pt.  They have this pt listed as the last name of Nulty.    They will refax the form that is needed for the equipment that the pt will need and a refill of the duoneb.  I have requested that they fax this to the triage fax and I will place in MW look at once received.

## 2016-08-17 NOTE — Telephone Encounter (Signed)
Received fax from [facility] and this has been placed in MW look at.  Will forward to LR to follow up on

## 2016-12-19 DIAGNOSIS — A630 Anogenital (venereal) warts: Secondary | ICD-10-CM | POA: Insufficient documentation

## 2016-12-19 DIAGNOSIS — D126 Benign neoplasm of colon, unspecified: Secondary | ICD-10-CM | POA: Insufficient documentation

## 2018-06-11 NOTE — Progress Notes (Deleted)
Psychiatric Initial Adult Assessment   Patient Identification: Sean Ramos MRN:  [id number] Date of Evaluation:  06/11/2018 Referral Source: Monico Blitz, MD Chief Complaint:   Visit Diagnosis: No diagnosis found.  History of Present Illness:   Sean Ramos is a 63 y.o. year old male with a history of COPD, hypertension, who is referred for     Associated Signs/Symptoms: Depression Symptoms:  {DEPRESSION SYMPTOMS:20000} (Hypo) Manic Symptoms:  {BHH MANIC SYMPTOMS:22872} Anxiety Symptoms:  {BHH ANXIETY SYMPTOMS:22873} Psychotic Symptoms:  {BHH PSYCHOTIC SYMPTOMS:22874} PTSD Symptoms: {BHH PTSD SYMPTOMS:22875}  Past Psychiatric History:  Outpatient:  Psychiatry admission:  Previous suicide attempt:  Past trials of medication:  History of violence:   Previous Psychotropic Medications: {YES/NO:21197}  Substance Abuse History in the last 12 months:  {yes no:314532}  Consequences of Substance Abuse: {BHH CONSEQUENCES OF SUBSTANCE ABUSE:22880}  Past Medical History: No past medical history on file. *** The histories are not reviewed yet. Please review them in the "History" navigator section and refresh this [place].  Family Psychiatric History: ***  Family History: No family history on file.  Social History:   Social History   Socioeconomic History  . Marital status: Legally Separated    Spouse name: Not on file  . Number of children: Not on file  . Years of education: Not on file  . Highest education level: Not on file  Occupational History  . Not on file  Social Needs  . Financial resource strain: Not on file  . Food insecurity:    Worry: Not on file    Inability: Not on file  . Transportation needs:    Medical: Not on file    Non-medical: Not on file  Tobacco Use  . Smoking status: Current Every Day Smoker    Packs/day: 1.50    Years: 46.00    Pack years: 69.00    Types: Cigarettes  . Smokeless tobacco: Never Used  Substance and Sexual  Activity  . Alcohol use: No    Alcohol/week: 0.0 standard drinks  . Drug use: No  . Sexual activity: Not on file  Lifestyle  . Physical activity:    Days per week: Not on file    Minutes per session: Not on file  . Stress: Not on file  Relationships  . Social connections:    Talks on phone: Not on file    Gets together: Not on file    Attends religious service: Not on file    Active member of club or organization: Not on file    Attends meetings of clubs or organizations: Not on file    Relationship status: Not on file  Other Topics Concern  . Not on file  Social History Narrative  . Not on file    Additional Social History: ***  Allergies:  No Known Allergies  Metabolic Disorder Labs: No results found for: HGBA1C, MPG No results found for: PROLACTIN No results found for: CHOL, TRIG, HDL, CHOLHDL, VLDL, LDLCALC No results found for: TSH  Therapeutic Level Labs: No results found for: LITHIUM No results found for: CBMZ No results found for: VALPROATE  Current Medications: Current Outpatient Medications  Medication Sig Dispense Refill  . albuterol (PROAIR HFA) 108 (90 Base) MCG/ACT inhaler Inhale 2 puffs into the lungs every 6 (six) hours as needed for wheezing or shortness of breath.    Marland Kitchen aspirin 81 MG tablet Take 81 mg by mouth daily.    . budesonide-formoterol (SYMBICORT) 160-4.5 MCG/ACT inhaler Inhale 2 puffs into the  lungs 2 (two) times daily.    . clonazePAM (KLONOPIN) 0.5 MG tablet Take 0.5 mg by mouth 3 (three) times daily as needed for anxiety.    Marland Kitchen ipratropium-albuterol (DUONEB) 0.5-2.5 (3) MG/3ML SOLN Take 3 mLs by nebulization every 4 (four) hours as needed.    Marland Kitchen NIFEdipine (PROCARDIA XL/ADALAT-CC) 60 MG 24 hr tablet Take 60 mg by mouth daily.    . potassium chloride SA (K-DUR,KLOR-CON) 20 MEQ tablet Take 20 mEq by mouth daily.    . simvastatin (ZOCOR) 20 MG tablet Take 20 mg by mouth daily.    Marland Kitchen terazosin (HYTRIN) 5 MG capsule Take 5 mg by mouth at bedtime.     . Tiotropium Bromide Monohydrate (SPIRIVA RESPIMAT) 2.5 MCG/ACT AERS 2 puffs each am 1 Inhaler 11  . valsartan-hydrochlorothiazide (DIOVAN-HCT) 160-25 MG tablet Take 1 tablet by mouth daily.    Marland Kitchen zolpidem (AMBIEN) 10 MG tablet Take 10 mg by mouth at bedtime as needed for sleep.     No current facility-administered medications for this visit.     Musculoskeletal: Strength & Muscle Tone: within normal limits Gait & Station: normal Patient leans: N/A  Psychiatric Specialty Exam: ROS  There were no vitals taken for this visit.There is no height or weight on file to calculate BMI.  General Appearance: Fairly Groomed  Eye Contact:  Good  Speech:  Clear and Coherent  Volume:  Normal  Mood:  {BHH MOOD:22306}  Affect:  {Affect (PAA):22687}  Thought Process:  Coherent  Orientation:  Full (Time, Place, and Person)  Thought Content:  Logical  Suicidal Thoughts:  {ST/HT (PAA):22692}  Homicidal Thoughts:  {ST/HT (PAA):22692}  Memory:  Immediate;   Good  Judgement:  {Judgement (PAA):22694}  Insight:  {Insight (PAA):22695}  Psychomotor Activity:  Normal  Concentration:  Concentration: Good and Attention Span: Good  Recall:  Good  Fund of Knowledge:Good  Language: Good  Akathisia:  No  Handed:  Right  AIMS (if indicated):  not done  Assets:  Communication Skills Desire for Improvement  ADL's:  Intact  Cognition: WNL  Sleep:  {BHH GOOD/FAIR/POOR:22877}   Screenings:   Assessment and Plan:  Assessment  Plan  The patient demonstrates the following risk factors for suicide: Chronic risk factors for suicide include: {Chronic Risk Factors for TWSFKCL:27517001}. Acute risk factors for suicide include: {Acute Risk Factors for VCBSWHQ:75916384}. Protective factors for this patient include: {Protective Factors for Suicide YKZL:93570177}. Considering these factors, the overall suicide risk at this point appears to be {Desc; low/moderate/high:110033}. Patient {ACTION; IS/IS LTJ:03009233}  appropriate for outpatient follow up.     Norman Clay, MD 3/11/20204:15 PM

## 2018-06-17 ENCOUNTER — Ambulatory Visit (HOSPITAL_COMMUNITY): Payer: Self-pay | Admitting: Psychiatry

## 2019-04-21 DIAGNOSIS — M47812 Spondylosis without myelopathy or radiculopathy, cervical region: Secondary | ICD-10-CM | POA: Diagnosis not present

## 2019-04-21 DIAGNOSIS — M5412 Radiculopathy, cervical region: Secondary | ICD-10-CM | POA: Diagnosis not present

## 2019-04-21 DIAGNOSIS — R079 Chest pain, unspecified: Secondary | ICD-10-CM | POA: Diagnosis not present

## 2019-04-21 DIAGNOSIS — F17290 Nicotine dependence, other tobacco product, uncomplicated: Secondary | ICD-10-CM | POA: Diagnosis not present

## 2019-04-21 DIAGNOSIS — Z8673 Personal history of transient ischemic attack (TIA), and cerebral infarction without residual deficits: Secondary | ICD-10-CM | POA: Diagnosis not present

## 2019-04-23 DIAGNOSIS — Z87891 Personal history of nicotine dependence: Secondary | ICD-10-CM | POA: Diagnosis not present

## 2019-04-23 DIAGNOSIS — Z6830 Body mass index (BMI) 30.0-30.9, adult: Secondary | ICD-10-CM | POA: Diagnosis not present

## 2019-04-23 DIAGNOSIS — I639 Cerebral infarction, unspecified: Secondary | ICD-10-CM | POA: Diagnosis not present

## 2019-04-23 DIAGNOSIS — G589 Mononeuropathy, unspecified: Secondary | ICD-10-CM | POA: Diagnosis not present

## 2019-04-23 DIAGNOSIS — I10 Essential (primary) hypertension: Secondary | ICD-10-CM | POA: Diagnosis not present

## 2019-04-23 DIAGNOSIS — Z299 Encounter for prophylactic measures, unspecified: Secondary | ICD-10-CM | POA: Diagnosis not present

## 2019-04-23 DIAGNOSIS — J449 Chronic obstructive pulmonary disease, unspecified: Secondary | ICD-10-CM | POA: Diagnosis not present

## 2019-04-27 DIAGNOSIS — I10 Essential (primary) hypertension: Secondary | ICD-10-CM | POA: Diagnosis not present

## 2019-04-27 DIAGNOSIS — Z299 Encounter for prophylactic measures, unspecified: Secondary | ICD-10-CM | POA: Diagnosis not present

## 2019-04-27 DIAGNOSIS — Z6831 Body mass index (BMI) 31.0-31.9, adult: Secondary | ICD-10-CM | POA: Diagnosis not present

## 2019-04-27 DIAGNOSIS — Z713 Dietary counseling and surveillance: Secondary | ICD-10-CM | POA: Diagnosis not present

## 2019-04-27 DIAGNOSIS — M549 Dorsalgia, unspecified: Secondary | ICD-10-CM | POA: Diagnosis not present

## 2019-05-12 DIAGNOSIS — F419 Anxiety disorder, unspecified: Secondary | ICD-10-CM | POA: Diagnosis not present

## 2019-05-12 DIAGNOSIS — R5383 Other fatigue: Secondary | ICD-10-CM | POA: Diagnosis not present

## 2019-05-12 DIAGNOSIS — E7800 Pure hypercholesterolemia, unspecified: Secondary | ICD-10-CM | POA: Diagnosis not present

## 2019-05-12 DIAGNOSIS — Z1331 Encounter for screening for depression: Secondary | ICD-10-CM | POA: Diagnosis not present

## 2019-05-12 DIAGNOSIS — M542 Cervicalgia: Secondary | ICD-10-CM | POA: Diagnosis not present

## 2019-05-12 DIAGNOSIS — M549 Dorsalgia, unspecified: Secondary | ICD-10-CM | POA: Diagnosis not present

## 2019-05-12 DIAGNOSIS — Z79899 Other long term (current) drug therapy: Secondary | ICD-10-CM | POA: Diagnosis not present

## 2019-05-12 DIAGNOSIS — Z7189 Other specified counseling: Secondary | ICD-10-CM | POA: Diagnosis not present

## 2019-05-12 DIAGNOSIS — Z1339 Encounter for screening examination for other mental health and behavioral disorders: Secondary | ICD-10-CM | POA: Diagnosis not present

## 2019-05-12 DIAGNOSIS — Z0000 Encounter for general adult medical examination without abnormal findings: Secondary | ICD-10-CM | POA: Diagnosis not present

## 2019-05-12 DIAGNOSIS — Z6830 Body mass index (BMI) 30.0-30.9, adult: Secondary | ICD-10-CM | POA: Diagnosis not present

## 2019-05-12 DIAGNOSIS — Z125 Encounter for screening for malignant neoplasm of prostate: Secondary | ICD-10-CM | POA: Diagnosis not present

## 2019-05-19 DIAGNOSIS — F17210 Nicotine dependence, cigarettes, uncomplicated: Secondary | ICD-10-CM | POA: Diagnosis not present

## 2019-05-19 DIAGNOSIS — I10 Essential (primary) hypertension: Secondary | ICD-10-CM | POA: Diagnosis not present

## 2019-05-19 DIAGNOSIS — Z299 Encounter for prophylactic measures, unspecified: Secondary | ICD-10-CM | POA: Diagnosis not present

## 2019-05-19 DIAGNOSIS — Z6832 Body mass index (BMI) 32.0-32.9, adult: Secondary | ICD-10-CM | POA: Diagnosis not present

## 2019-05-19 DIAGNOSIS — Z87891 Personal history of nicotine dependence: Secondary | ICD-10-CM | POA: Diagnosis not present

## 2019-05-19 DIAGNOSIS — M542 Cervicalgia: Secondary | ICD-10-CM | POA: Diagnosis not present

## 2019-06-03 DIAGNOSIS — M542 Cervicalgia: Secondary | ICD-10-CM | POA: Diagnosis not present

## 2019-06-03 DIAGNOSIS — M47812 Spondylosis without myelopathy or radiculopathy, cervical region: Secondary | ICD-10-CM | POA: Diagnosis not present

## 2019-06-03 DIAGNOSIS — R200 Anesthesia of skin: Secondary | ICD-10-CM | POA: Diagnosis not present

## 2019-06-03 DIAGNOSIS — M6281 Muscle weakness (generalized): Secondary | ICD-10-CM | POA: Diagnosis not present

## 2019-06-11 ENCOUNTER — Ambulatory Visit: Payer: Self-pay | Attending: Internal Medicine

## 2019-06-11 ENCOUNTER — Ambulatory Visit: Payer: Self-pay

## 2019-06-11 DIAGNOSIS — Z23 Encounter for immunization: Secondary | ICD-10-CM

## 2019-06-11 NOTE — Progress Notes (Signed)
   Covid-19 Vaccination Clinic  Name:  Sean Ramos    MRN: [id number] DOB: 12/18/1955  06/11/2019  Mr. Sean Ramos was observed post Covid-19 immunization for 15 minutes without incident. He was provided with Vaccine Information Sheet and instruction to access the V-Safe system.   Mr. Sean Ramos was instructed to call 911 with any severe reactions post vaccine: Marland Kitchen Difficulty breathing  . Swelling of face and throat  . A fast heartbeat  . A bad rash all over body  . Dizziness and weakness   Immunizations Administered    Name Date Dose VIS Date Route   Moderna COVID-19 Vaccine 06/11/2019  9:02 AM 0.5 mL 03/03/2019 Intramuscular   Manufacturer: Moderna   Lot: [id number]   [id number]

## 2019-06-23 DIAGNOSIS — R202 Paresthesia of skin: Secondary | ICD-10-CM | POA: Diagnosis not present

## 2019-06-23 DIAGNOSIS — R200 Anesthesia of skin: Secondary | ICD-10-CM | POA: Diagnosis not present

## 2019-06-23 DIAGNOSIS — M79601 Pain in right arm: Secondary | ICD-10-CM | POA: Diagnosis not present

## 2019-06-23 DIAGNOSIS — Z6833 Body mass index (BMI) 33.0-33.9, adult: Secondary | ICD-10-CM | POA: Diagnosis not present

## 2019-06-30 ENCOUNTER — Other Ambulatory Visit: Payer: Self-pay | Admitting: Neurosurgery

## 2019-06-30 ENCOUNTER — Telehealth: Payer: Self-pay | Admitting: Nurse Practitioner

## 2019-06-30 DIAGNOSIS — M79601 Pain in right arm: Secondary | ICD-10-CM

## 2019-06-30 NOTE — Telephone Encounter (Signed)
Phone call to patient to verify medication list and allergies for myelogram procedure. Pt aware he will not need to hold any medications for this procedure. Pre and post procedure instructions reviewed with pt. Pt verbalized understanding.  

## 2019-07-14 ENCOUNTER — Ambulatory Visit: Payer: Self-pay | Attending: Internal Medicine

## 2019-07-14 ENCOUNTER — Ambulatory Visit
Admission: RE | Admit: 2019-07-14 | Discharge: 2019-07-14 | Disposition: A | Discharge status: Home / Self Care | Payer: Medicare HMO | Source: Ambulatory Visit | Attending: Neurosurgery | Admitting: Neurosurgery

## 2019-07-14 ENCOUNTER — Other Ambulatory Visit: Payer: Self-pay

## 2019-07-14 DIAGNOSIS — M79601 Pain in right arm: Secondary | ICD-10-CM

## 2019-07-14 DIAGNOSIS — M542 Cervicalgia: Secondary | ICD-10-CM | POA: Diagnosis not present

## 2019-07-14 DIAGNOSIS — Z23 Encounter for immunization: Secondary | ICD-10-CM

## 2019-07-14 DIAGNOSIS — M5023 Other cervical disc displacement, cervicothoracic region: Secondary | ICD-10-CM | POA: Diagnosis not present

## 2019-07-14 MED ORDER — DIAZEPAM 5 MG PO TABS
10.0000 mg | ORAL_TABLET | Freq: Once | ORAL | Status: AC
  Administered 2019-07-14: 10 mg via ORAL

## 2019-07-14 MED ORDER — IOPAMIDOL (ISOVUE-M 300) INJECTION 61%
10.0000 mL | Freq: Once | INTRAMUSCULAR | Status: AC | PRN
  Administered 2019-07-14: 10 mL via INTRATHECAL

## 2019-07-14 NOTE — Progress Notes (Signed)
   Covid-19 Vaccination Clinic  Name:  Sean Ramos    MRN: [id number] DOB: 1956/01/27  07/14/2019  Sean Ramos was observed post Covid-19 immunization for 15 minutes without incident. He was provided with Vaccine Information Sheet and instruction to access the V-Safe system.   Sean Ramos was instructed to call 911 with any severe reactions post vaccine: Marland Kitchen Difficulty breathing  . Swelling of face and throat  . A fast heartbeat  . A bad rash all over body  . Dizziness and weakness   Immunizations Administered    Name Date Dose VIS Date Route   Moderna COVID-19 Vaccine 07/14/2019  8:29 AM 0.5 mL 03/03/2019 Intramuscular   Manufacturer: Moderna   Lot: [id number]   [id number]

## 2019-07-14 NOTE — Discharge Instructions (Signed)

## 2019-07-30 DIAGNOSIS — G5621 Lesion of ulnar nerve, right upper limb: Secondary | ICD-10-CM | POA: Diagnosis not present

## 2019-07-30 DIAGNOSIS — G5601 Carpal tunnel syndrome, right upper limb: Secondary | ICD-10-CM | POA: Diagnosis not present

## 2019-08-10 DIAGNOSIS — G5621 Lesion of ulnar nerve, right upper limb: Secondary | ICD-10-CM | POA: Diagnosis not present

## 2019-08-10 DIAGNOSIS — G5601 Carpal tunnel syndrome, right upper limb: Secondary | ICD-10-CM | POA: Diagnosis not present

## 2019-08-10 DIAGNOSIS — M542 Cervicalgia: Secondary | ICD-10-CM | POA: Diagnosis not present

## 2019-08-27 DIAGNOSIS — G5601 Carpal tunnel syndrome, right upper limb: Secondary | ICD-10-CM | POA: Diagnosis not present

## 2019-08-27 DIAGNOSIS — G5621 Lesion of ulnar nerve, right upper limb: Secondary | ICD-10-CM | POA: Diagnosis not present

## 2019-09-15 DIAGNOSIS — I723 Aneurysm of iliac artery: Secondary | ICD-10-CM | POA: Diagnosis not present

## 2019-09-15 DIAGNOSIS — Z299 Encounter for prophylactic measures, unspecified: Secondary | ICD-10-CM | POA: Diagnosis not present

## 2019-09-15 DIAGNOSIS — J449 Chronic obstructive pulmonary disease, unspecified: Secondary | ICD-10-CM | POA: Diagnosis not present

## 2019-09-15 DIAGNOSIS — I10 Essential (primary) hypertension: Secondary | ICD-10-CM | POA: Diagnosis not present

## 2019-10-23 ENCOUNTER — Other Ambulatory Visit: Payer: Self-pay

## 2019-10-23 ENCOUNTER — Ambulatory Visit (INDEPENDENT_AMBULATORY_CARE_PROVIDER_SITE_OTHER): Payer: Medicare HMO | Admitting: Internal Medicine

## 2019-10-23 ENCOUNTER — Encounter: Payer: Self-pay | Admitting: Internal Medicine

## 2019-10-23 VITALS — BP 140/82 | HR 101 | Temp 97.8°F | Ht 67.0 in | Wt 215.0 lb

## 2019-10-23 DIAGNOSIS — R69 Illness, unspecified: Secondary | ICD-10-CM | POA: Diagnosis not present

## 2019-10-23 DIAGNOSIS — J449 Chronic obstructive pulmonary disease, unspecified: Secondary | ICD-10-CM | POA: Diagnosis not present

## 2019-10-23 DIAGNOSIS — F17210 Nicotine dependence, cigarettes, uncomplicated: Secondary | ICD-10-CM | POA: Diagnosis not present

## 2019-10-23 MED ORDER — BREZTRI AEROSPHERE 160-9-4.8 MCG/ACT IN AERO
2.0000 | INHALATION_SPRAY | Freq: Two times a day (BID) | RESPIRATORY_TRACT | 0 refills | Status: DC
Start: 2019-10-23 — End: 2019-12-11

## 2019-10-23 NOTE — Patient Instructions (Addendum)
I do recommned low dose Chest CT for the next 11 years  - contract [name] NP at the [place] office to arrange   Wean off vapes if possible   Plan A = Automatic = Always=   Breztri Take 2 puffs first thing in am and then another 2 puffs about 12 hours later.    Work on inhaler technique:  relax and gently blow all the way out then take a nice smooth deep breath back in, triggering the inhaler at same time you start breathing in.  Hold for up to 5 seconds if you can. Blow out thru nose. Rinse and gargle with water when done   Plan B = Backup (to supplement plan A, not to replace it) Only use your albuterol (ventolin)  inhaler as a rescue medication to be used if you can't catch your breath by resting or doing a relaxed purse lip breathing pattern.  - The less you use it, the better it will work when you need it. - Ok to use the inhaler up to 2 puffs  every 4 hours if you must but call for appointment if use goes up over your usual need - Don't leave home without it !!  (think of it like the spare tire for your car)   Plan C = Crisis (instead of Plan B but only if Plan B stops working) - only use your albuterol nebulizer if you first try Plan B and it fails to help > ok to use the nebulizer up to every 4 hours but if start needing it regularly call for immediate appointment   Try albuterol 15 min before an activity that you know would make you short of breath and see if it makes any difference and if makes none then don't take it after activity unless you can't catch your breath.      Please schedule a follow up office visit in 6-8  Weeks with PFTS with breztri x 2 puffs that am , call sooner if needed

## 2019-10-23 NOTE — Assessment & Plan Note (Signed)
PFT's April 28, 2010 FEV1 1.03 (33%) ratio 45 and no better p B2, DLC0 65% - Stopped working Apr 21 2015  - 08/31/2015   try add spiriva resp to sym 160  - 08/31/2015   Walked RA x one lap @ 185 stopped due to  Sob, nl pace, no desat  PFT's  09/02/15 FEV1 0.88(32%) ratio 43 p 15% improvement from saba p symbicort 160 / spiriva prior ith DLCO  38 corrects to 81 for alv volume   - 09/05/2015 completed disability paperwork  - 09/28/2015 completed additional disability paperwork  - 10/23/2019  After extensive coaching inhaler device,  effectiveness =    50% > change breo to breztri     Group D in terms of symptom/risk and laba/lama/ICS  therefore appropriate rx at this point >>>  breztri 2bid and prn saba   I spent extra time with pt today reviewing appropriate use of albuterol for prn use on exertion with the following points: 1) saba is for relief of sob that does not improve by walking a slower pace or resting but rather if the pt does not improve after trying this first. 2) If the pt is convinced, as many are, that saba helps recover from activity faster then it's easy to tell if this is the case by re-challenging : ie stop, take the inhaler, then p 5 minutes try the exact same activity (intensity of workload) that just caused the symptoms and see if they are substantially diminished or not after saba 3) if there is an activity that reproducibly causes the symptoms, try the saba 15 min before the activity on alternate days   If in fact the saba really does help, then fine to continue to use it prn but advised may need to look closer at the maintenance regimen being used to achieve better control of airways disease with exertion.   Advised:  formulary restrictions will be an ongoing challenge for the forseable future and I would be happy to pick an alternative if the pt will first  provide me a list of them -  pt  will need to return here for training for any new device that is required eg dpi vs hfa  vs respimat.    In the meantime we can always provide samples so that the patient never runs out of any needed respiratory medications.

## 2019-10-23 NOTE — Assessment & Plan Note (Signed)
Stopped 09/2015 but still using e cigs as of 10/23/2019  - referred to [facility] via [facility]  10/23/2019   Low-dose CT lung cancer screening is recommended for patients who are 67-65 years of age with a 20+ pack-year history of smoking, and who are currently smoking or quit <=15 years ago.   >>> eligble thru 2032    Also advised on weaning off e cigs as a "one way bridge" off all tobacco/nictine products          Each maintenance medication was reviewed in detail including emphasizing most importantly the difference between maintenance and prns and under what circumstances the prns are to be triggered using an action plan format where appropriate.  Total time for H and P, chart review, counseling, teaching device and generating customized AVS unique to this office visit / charting = 60

## 2019-10-23 NOTE — Progress Notes (Signed)
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 Age 64 ? Dx ? And reports  flunked pfts for job around 2000 and on disability and since then  dx as copd in [place] 2011 p seen in pulmonary clinic 04/28/10 and referred back by Dr Anthonette Legato 08/31/2015  With gold III cpd criteria in Jan 2012     History of Present Illness  April 28, 2010 1st pulmonary office eval ov cc doe and cough onset in 40's and progressed to point where doe shopping leans on cart and struggles to get groceries back to his house (lives next to store). has noct wheeze disturbs sleep. can't tell if dulera helpiing at this point not really using it. Dx gold III/IV copd  rec dulera 100 2bid / prn saba  Stop smoking   08/31/2015 1st EPIC  [facility] office visit//  Amaya Blakeman  On symbicort 160 poor hfa  Chief Complaint  Patient presents with  . Pulmonary Consult    Referred by Dr. Manuella Ghazi. Pt seen here in the past- last in 2012. He states that his breathing has been worse over the past few months. He states that he gets SOB walking "not far" and also when he gets upset about something.   wakes up coughing/ congested each am/bad days  can't walk across house s sob/other days does ok very slow to mailbox slt up hill back to house. Sob at rest when gets upset / some better with saba hfa and neb  rec Plan A = Automatic = Symbicort 160 Take 2 puffs first thing in am and then another 2 puffs about 12 hours later.  Spiriva 2 pffs each am (like high octane)  Plan B = Backup Only use your albuterol (proair) as a rescue medication  Plan C = Crisis - only use your albuterol nebulizer if you first try Plan B and it fails  Think of the e cigs as a one way bridge off all tobacco products      09/28/2015  f/u ov/Quame Spratlin re: GOLDI III/IV copd/ still smoking rx  Symbicort/ spiriva respimat  02 prn   Chief Complaint  Patient presents with  . Follow-up    Breathing is unchanged. No new co's today. He is using proair 1 x daily on average and neb 2 x daily on average.   MMRC3  = can't walk 100 yards even at a slow pace at a flat grade s stopping due to sob  rec  Plan A = Automatic = Symbicort 160 Take 2 puffs first thing in am and then another 2 puffs about 12 hours later.                                      Spiriva 2 pffs each am (like high octane fuel)  Plan B = Backup Only use your albuterol (proair) as a rescue medication   Plan C = Crisis - only use your albuterol nebulizer if you first try Plan B     10/23/2019  f/u ov/Torie Priebe re: GOLD III/ IV  -self referral / smoking e cigs and maint breo 100  Chief Complaint  Patient presents with  . Consult    Patient last seen  in 2017. Patient has COPD and shortness of breath all the time exertion makes it worse. Has a cough but can't get up mucus.   Dyspnea:  mb is now a struggle/and back about 100 ft  Cough: some am cough /congestion > minimal mucoid   Sleeping: flat bed with pillows  SABA use: once a week / very rarely neb  02: none    No obvious day to day or daytime variability or assoc excess/ purulent sputum or mucus plugs or hemoptysis or cp or chest tightness, subjective wheeze or overt sinus or hb symptoms.    Sleeping  without nocturnal  or early am exacerbation  of respiratory  c/o's or need for noct saba. Also denies any obvious fluctuation of symptoms with weather or environmental changes or other aggravating or alleviating factors except as outlined above   No unusual exposure hx or h/o childhood pna/ asthma or knowledge of premature birth.  Current Allergies, Complete Past Medical History, Past Surgical History, Family History, and Social History were reviewed in [employer] record.  ROS  The following are not active complaints unless bolded Hoarseness, sore throat, dysphagia, dental problems,  itching, sneezing,  nasal congestion or discharge of excess mucus or purulent secretions, ear ache,   fever, chills, sweats, unintended wt loss or wt gain, classically pleuritic or exertional cp,  orthopnea pnd or arm/hand swelling  or leg swelling, presyncope, palpitations, abdominal pain, anorexia, nausea, vomiting, diarrhea  or change in bowel habits or change in bladder habits, change in stools or change in urine, dysuria, hematuria,  rash, arthralgias, visual complaints, headache, numbness, weakness or ataxia or problems with walking or coordination,  change in mood or  memory.        Current Meds  Medication Sig  . albuterol (PROAIR HFA) 108 (90 Base) MCG/ACT inhaler Inhale 2 puffs into the lungs every 6 (six) hours as needed for wheezing or shortness of breath.  Marland Kitchen aspirin 81 MG tablet Take 81 mg by mouth daily.  . clonazePAM (KLONOPIN) 0.5 MG tablet Take 0.5 mg by mouth 3 (three) times daily as needed for anxiety.  . fluticasone furoate-vilanterol (BREO ELLIPTA) 100-25 MCG/INH AEPB Inhale 1 puff into the lungs daily.  . hydrochlorothiazide (HYDRODIURIL) 25 MG tablet Take 1 tablet by mouth daily.  Marland Kitchen ipratropium-albuterol (DUONEB) 0.5-2.5 (3) MG/3ML SOLN Take 3 mLs by nebulization every 4 (four) hours as needed.  Marland Kitchen NIFEdipine (PROCARDIA XL/ADALAT-CC) 60 MG 24 hr tablet Take 60 mg by mouth daily.  Marland Kitchen omega-3 acid ethyl esters (LOVAZA) 1 g capsule Take 1 g by mouth daily.  . potassium chloride SA (K-DUR,KLOR-CON) 20 MEQ tablet Take 20 mEq by mouth daily.  . simvastatin (ZOCOR) 20 MG tablet Take 20 mg by mouth daily.  Marland Kitchen terazosin (HYTRIN) 5 MG capsule Take 5 mg by mouth at bedtime.  Marland Kitchen tiZANidine (ZANAFLEX) 4 MG tablet Take 4 mg by mouth every 6 (six) hours as needed for muscle spasms.  . valsartan-hydrochlorothiazide (DIOVAN-HCT) 160-25 MG tablet Take 1 tablet by mouth daily.                   Objective:   Physical Exam  10/23/2019       215   09/28/15 198 lb (89.812 kg)  08/31/15 197  lb (89.359 kg)  04/28/10 200 lb 8 oz (90.946 kg)    Pleasant amb mod obese bm nad  Vital signs reviewed  10/23/2019  - Note at rest 02 sats  95% on  RA    HEENT : pt wearing mask not removed for exam due to covid -19 concerns.    NECK :  without JVD/Nodes/TM/ nl carotid upstrokes bilaterally   LUNGS: no acc muscle use,  Mod barrel  contour chest wall with bilateral  Distant bs s audible wheeze and  without cough on insp or exp maneuvers and mod  Hyperresonant  to  percussion bilaterally     CV:  RRR  no s3 or murmur or increase in P2, and no edema   ABD:  Obese soft and nontender with pos mid insp Hoover's  in the supine position. No bruits or organomegaly appreciated, bowel sounds nl  MS:     ext warm without deformities, calf tenderness, cyanosis or clubbing No obvious joint restrictions   SKIN: warm and dry without lesions    NEURO:  alert, approp, nl sensorium with  no motor or cerebellar deficits apparent.              Assessment:

## 2019-11-23 ENCOUNTER — Other Ambulatory Visit: Payer: Self-pay

## 2019-11-23 ENCOUNTER — Other Ambulatory Visit (HOSPITAL_COMMUNITY)
Admission: RE | Admit: 2019-11-23 | Discharge: 2019-11-23 | Disposition: A | Discharge status: Home / Self Care | Payer: Medicare HMO | Source: Ambulatory Visit | Attending: Internal Medicine | Admitting: Internal Medicine

## 2019-11-23 DIAGNOSIS — Z20822 Contact with and (suspected) exposure to covid-19: Secondary | ICD-10-CM | POA: Diagnosis not present

## 2019-11-23 DIAGNOSIS — Z01812 Encounter for preprocedural laboratory examination: Secondary | ICD-10-CM | POA: Diagnosis not present

## 2019-11-23 LAB — SARS CORONAVIRUS 2 (TAT 6-24 HRS): SARS Coronavirus 2: NEGATIVE

## 2019-11-25 ENCOUNTER — Ambulatory Visit (HOSPITAL_COMMUNITY)
Admission: RE | Admit: 2019-11-25 | Discharge: 2019-11-25 | Disposition: A | Discharge status: Home / Self Care | Payer: Medicare HMO | Source: Ambulatory Visit | Attending: Internal Medicine | Admitting: Internal Medicine

## 2019-11-25 ENCOUNTER — Other Ambulatory Visit: Payer: Self-pay

## 2019-11-25 DIAGNOSIS — J449 Chronic obstructive pulmonary disease, unspecified: Secondary | ICD-10-CM | POA: Diagnosis present

## 2019-11-25 LAB — PULMONARY FUNCTION TEST
DL/VA % pred: 103 %
DL/VA: 4.35 ml/min/mmHg/L
DLCO unc % pred: 56 %
DLCO unc: 13.48 ml/min/mmHg
FEF 25-75 Post: 0.27 L/sec
FEF 25-75 Pre: 0.23 L/sec
FEF2575-%Change-Post: 17 %
FEF2575-%Pred-Post: 11 %
FEF2575-%Pred-Pre: 9 %
FEV1-%Change-Post: -8 %
FEV1-%Pred-Post: 28 %
FEV1-%Pred-Pre: 31 %
FEV1-Post: 0.74 L
FEV1-Pre: 0.81 L
FEV1FVC-%Change-Post: -6 %
FEV1FVC-%Pred-Pre: 65 %
FEV6-%Change-Post: 3 %
FEV6-%Pred-Post: 44 %
FEV6-%Pred-Pre: 43 %
FEV6-Post: 1.46 L
FEV6-Pre: 1.41 L
FEV6FVC-%Change-Post: 6 %
FEV6FVC-%Pred-Post: 98 %
FEV6FVC-%Pred-Pre: 92 %
FVC-%Change-Post: -2 %
FVC-%Pred-Post: 45 %
FVC-%Pred-Pre: 46 %
FVC-Post: 1.57 L
FVC-Pre: 1.6 L
Post FEV1/FVC ratio: 47 %
Post FEV6/FVC ratio: 94 %
Pre FEV1/FVC ratio: 51 %
Pre FEV6/FVC Ratio: 88 %
RV % pred: 164 %
RV: 3.54 L
TLC % pred: 86 %
TLC: 5.42 L

## 2019-11-25 MED ORDER — ALBUTEROL SULFATE (2.5 MG/3ML) 0.083% IN NEBU
2.5000 mg | INHALATION_SOLUTION | Freq: Once | RESPIRATORY_TRACT | Status: AC
  Administered 2019-11-25: 2.5 mg via RESPIRATORY_TRACT

## 2019-11-30 NOTE — Progress Notes (Signed)
Spoke with the pt's spouse and notified of results and she verbalized understanding.

## 2019-12-11 ENCOUNTER — Other Ambulatory Visit: Payer: Self-pay

## 2019-12-11 ENCOUNTER — Encounter: Payer: Self-pay | Admitting: Internal Medicine

## 2019-12-11 ENCOUNTER — Ambulatory Visit: Payer: Medicare HMO | Admitting: Internal Medicine

## 2019-12-11 DIAGNOSIS — J449 Chronic obstructive pulmonary disease, unspecified: Secondary | ICD-10-CM

## 2019-12-11 MED ORDER — TRELEGY ELLIPTA 100-62.5-25 MCG/INH IN AEPB
1.0000 | INHALATION_SPRAY | Freq: Every day | RESPIRATORY_TRACT | 0 refills | Status: DC
Start: 2019-12-11 — End: 2020-01-21

## 2019-12-11 MED ORDER — TRELEGY ELLIPTA 100-62.5-25 MCG/INH IN AEPB
1.0000 | INHALATION_SPRAY | Freq: Every day | RESPIRATORY_TRACT | 1 refills | Status: DC
Start: 2019-12-11 — End: 2020-11-30

## 2019-12-11 NOTE — Progress Notes (Signed)
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 Age 64 ? Dx ? And reports  flunked pfts for job around 2000 and on disability and since then  dx as copd in [place] 2011 p seen in pulmonary clinic 04/28/10 and referred back by Dr Anthonette Legato 08/31/2015  With gold III cpd criteria in Jan 2012     History of Present Illness  April 28, 2010 1st pulmonary office eval ov cc doe and cough onset in 40's and progressed to point where doe shopping leans on cart and struggles to get groceries back to his house (lives next to store). has noct wheeze disturbs sleep. can't tell if dulera helpiing at this point not really using it. Dx gold III/IV copd  rec dulera 100 2bid / prn saba  Stop smoking   08/31/2015 1st EPIC  [facility] office visit//  Sean Ramos  On symbicort 160 poor hfa  Chief Complaint  Patient presents with  . Pulmonary Consult    Referred by Dr. Manuella Ghazi. Pt seen here in the past- last in 2012. He states that his breathing has been worse over the past few months. He states that he gets SOB walking "not far" and also when he gets upset about something.   wakes up coughing/ congested each am/bad days  can't walk across house s sob/other days does ok very slow to mailbox slt up hill back to house. Sob at rest when gets upset / some better with saba hfa and neb  rec Plan A = Automatic = Symbicort 160 Take 2 puffs first thing in am and then another 2 puffs about 12 hours later.  Spiriva 2 pffs each am (like high octane)  Plan B = Backup Only use your albuterol (proair) as a rescue medication  Plan C = Crisis - only use your albuterol nebulizer if you first try Plan B and it fails  Think of the e cigs as a one way bridge off all tobacco products      09/28/2015  f/u ov/Sean Ramos re: GOLDI III/IV copd/ still smoking rx  Symbicort/ spiriva respimat  02 prn   Chief Complaint  Patient presents with  . Follow-up    Breathing is unchanged. No new co's today. He is using proair 1 x daily on average and neb 2 x daily on average.   MMRC3  = can't walk 100 yards even at a slow pace at a flat grade s stopping due to sob  rec  Plan A = Automatic = Symbicort 160 Take 2 puffs first thing in am and then another 2 puffs about 12 hours later.                                      Spiriva 2 pffs each am (like high octane fuel)  Plan B = Backup Only use your albuterol (proair) as a rescue medication   Plan C = Crisis - only use your albuterol nebulizer if you first try Plan B     10/23/2019  f/u ov/Sean Ramos re: GOLD III/ IV  -self referral / smoking e cigs and maint breo 100 / re-establish Chief Complaint  Patient presents with  . Consult    Patient last  seen in 2017. Patient has COPD and shortness of breath all the time exertion makes it worse. Has a cough but can't get up mucus.   Dyspnea:  mb is now a struggle/and back about 100 ft  Cough: some am cough /congestion > minimal mucoid   Sleeping: flat bed with pillows  SABA use: once a week / very rarely neb  02: none  rec I do recommned low dose Chest CT for the next 11 years  - contract Sean Form NP at the [place] office to arrange  Wean off vapes if possible  Plan A = Automatic = Always=   Breztri Take 2 puffs first thing in am and then another 2 puffs about 12 hours later.  Work on inhaler technique:  Plan B = Backup (to supplement plan A, not to replace it) Only use your albuterol (ventolin)  inhaler Plan C = Crisis (instead of Plan B but only if Plan B stops working) - only use your albuterol nebulizer if you first try Plan B and it fails to help > ok to use the nebulizer up to every 4 hours but if start needing it regularly call for immediate appointment Try albuterol 15 min before an activity that you know would make you short of breath and see if it makes any difference and if makes none then  don't take it after activity unless you can't catch your breath.    Please schedule a follow up office visit in 6-8  Weeks with PFTS with breztri x 2 puffs that am  = GOLD 3 p "3 inhalers"    12/11/2019  f/u ov/[place] office/Sean Ramos re: GOLD 3  Chief Complaint  Patient presents with  . Follow-up    non-productive cough, shortness of breath with activity  Dyspnea:  No change mailbox and back, never pre challenges or rechallenges  Cough: dry cough Sleeping: poorly but not related to resp dz  SABA use: 2-3 x  02: None    No obvious day to day or daytime variability or assoc excess/ purulent sputum or mucus plugs or hemoptysis or cp or chest tightness, subjective wheeze or overt sinus or hb symptoms.   Sleeping flat  without nocturnal  or early am exacerbation  of respiratory  c/o's or need for noct saba. Also denies any obvious fluctuation of symptoms with weather or environmental changes or other aggravating or alleviating factors except as outlined above   No unusual exposure hx or h/o childhood pna/ asthma or knowledge of premature birth.  Current Allergies, Complete Past Medical History, Past Surgical History, Family History, and Social History were reviewed in [employer] record.  ROS  The following are not active complaints unless bolded Hoarseness, sore throat, dysphagia, dental problems, itching, sneezing,  nasal congestion or discharge of excess mucus or purulent secretions, ear ache,   fever, chills, sweats, unintended wt loss or wt gain, classically pleuritic or exertional cp,  orthopnea pnd or arm/hand swelling  or leg swelling, presyncope, palpitations, abdominal pain, anorexia, nausea, vomiting, diarrhea  or change in bowel habits or change in bladder habits, change in stools or change in urine, dysuria, hematuria,  rash, arthralgias, visual complaints, headache, numbness, weakness or ataxia or problems with walking or coordination,  change in mood or   memory.        Current Meds - - NOTE:   Unable to verify as accurately reflecting what pt takes     Medication Sig  . albuterol (PROAIR HFA) 108 (90  Base) MCG/ACT inhaler Inhale 2 puffs into the lungs every 6 (six) hours as needed for wheezing or shortness of breath.  Marland Kitchen aspirin 81 MG tablet Take 81 mg by mouth daily.  . clonazePAM (KLONOPIN) 0.5 MG tablet Take 0.5 mg by mouth 3 (three) times daily as needed for anxiety.  . hydrochlorothiazide (HYDRODIURIL) 25 MG tablet Take 1 tablet by mouth daily.  Marland Kitchen ipratropium-albuterol (DUONEB) 0.5-2.5 (3) MG/3ML SOLN Take 3 mLs by nebulization every 4 (four) hours as needed.  Marland Kitchen NIFEdipine (PROCARDIA XL/ADALAT-CC) 60 MG 24 hr tablet Take 60 mg by mouth daily.  Marland Kitchen omega-3 acid ethyl esters (LOVAZA) 1 g capsule Take 1 g by mouth daily.  . potassium chloride SA (K-DUR,KLOR-CON) 20 MEQ tablet Take 20 mEq by mouth daily.  . simvastatin (ZOCOR) 20 MG tablet Take 20 mg by mouth daily.  Marland Kitchen terazosin (HYTRIN) 5 MG capsule Take 5 mg by mouth at bedtime.  Marland Kitchen tiZANidine (ZANAFLEX) 4 MG tablet Take 4 mg by mouth every 6 (six) hours as needed for muscle spasms.  . valsartan-hydrochlorothiazide (DIOVAN-HCT) 160-25 MG tablet Take 1 tablet by mouth daily.                    Objective:   Physical Exam  Obese slow moving amb bm nad   12/11/2019       215  10/23/2019       215   09/28/15 198 lb (89.812 kg)  08/31/15 197 lb (89.359 kg)  04/28/10 200 lb 8 oz (90.946 kg)     Vital signs reviewed  12/11/2019  - Note at rest 02 sats  97% on RA    HEENT : pt wearing mask not removed for exam due to covid -19 concerns.    NECK :  without JVD/Nodes/TM/ nl carotid upstrokes bilaterally   LUNGS: no acc muscle use,  Mod barrel  contour chest wall with bilateral  Distant bs s audible wheeze and  without cough on insp or exp maneuvers and mod  Hyperresonant  to  percussion bilaterally     CV:  RRR  no s3 or murmur or increase in P2, and no edema   ABD:  Obese soft and  nontender with pos mid insp Hoover's  in the supine position. No bruits or organomegaly appreciated, bowel sounds nl  MS:     ext warm without deformities, calf tenderness, cyanosis or clubbing No obvious joint restrictions   SKIN: warm and dry without lesions    NEURO:  alert, approp, nl sensorium with  no motor or cerebellar deficits apparent.               Assessment:

## 2019-12-11 NOTE — Patient Instructions (Addendum)
Plan A = Automatic = Always=    Trelegy 712  one click daily (like high octane fuel)  Plan B = Backup (to supplement plan A, not to replace it) Only use your albuterol inhaler as a rescue medication to be used if you can't catch your breath by resting or doing a relaxed purse lip breathing pattern.  - The less you use it, the better it will work when you need it. - Ok to use the inhaler up to 2 puffs  every 4 hours if you must but call for appointment if use goes up over your usual need - Don't leave home without it !!  (think of it like the spare tire for your car)   Plan C = Crisis (instead of Plan B but only if Plan B stops working) - only use your albuterol nebulizer if you first try Plan B and it fails to help > ok to use the nebulizer up to every 4 hours but if start needing it regularly call for immediate appointment   Try albuterol 15 min before an activity that you know would make you short of breath and see if it makes any difference and if makes none then don't take it after activity unless you can't catch your breath.  Please schedule a follow up office visit in 6 weeks, call sooner if needed   Add needs cxr on return

## 2019-12-13 NOTE — Assessment & Plan Note (Signed)
Quit smoking 09/2015 PFT's April 28, 2010 FEV1 1.03 (33%) ratio 45 and no better p B2, DLC0 65% - Stopped working Apr 21 2015  - 08/31/2015   try add spiriva resp to sym 160  - 08/31/2015   Walked RA x one lap @ 185 stopped due to  Sob, nl pace, no desat  PFT's  09/02/15 FEV1 0.88(32%) ratio 43 p 15% improvement from saba p symbicort 160 / spiriva prior ith DLCO  38 corrects to 81 for alv volume   - 09/05/2015 completed disability paperwork  - 09/28/2015 completed additional disability paperwork  - 10/23/2019  After extensive coaching inhaler device,  effectiveness =    50% > change breo to breztri  - PFT's 11/25/19   FEV1 0.81 (31 % ) ratio 0.51  p 0 % improvement from saba p  "3 inhalers"   prior to study with DLCO  13.48 (56%) corrects to 4.35 (105%)  for alv volume and FV curve classic severe concavity    - 12/11/2019  After extensive coaching inhaler device,  effectiveness =    90%  -  12/11/2019   Walked RA  approx   400 ft  @ moderat pace  stopped due to  End of study c/o sob with sats still    Feet with sats still 96%    Group D in terms of symptom/risk and laba/lama/ICS  therefore appropriate rx at this point >>>  trelegy one click each am and try to stop all e cig intake by tapering nicotine strength gradually over the next 3 m  I spent extra time with pt today reviewing appropriate use of albuterol for prn use on exertion with the following points: 1) saba is for relief of sob that does not improve by walking a slower pace or resting but rather if the pt does not improve after trying this first. 2) If the pt is convinced, as many are, that saba helps recover from activity faster then it's easy to tell if this is the case by re-challenging : ie stop, take the inhaler, then p 5 minutes try the exact same activity (intensity of workload) that just caused the symptoms and see if they are substantially diminished or not after saba 3) if there is an activity that reproducibly causes the symptoms, try  the saba 15 min before the activity on alternate days   If in fact the saba really does help, then fine to continue to use it prn but advised may need to look closer at the maintenance regimen being used to achieve better control of airways disease with exertion.            Each maintenance medication was reviewed in detail including emphasizing most importantly the difference between maintenance and prns and under what circumstances the prns are to be triggered using an action plan format where appropriate.  Total time for H and P, chart review, counseling, teaching device  directly observing portions of ambulatory 02 saturation study/  and generating customized AVS unique to this office visit / charting = 32 min

## 2019-12-13 NOTE — Assessment & Plan Note (Signed)
Complicated by HBP/ Hyperlipidemia  Body mass index is 34.21 kg/m.   No results found for: TSH   Contributing to gerd risk/ doe/reviewed the need and the process to achieve and maintain neg calorie balance > defer f/u primary care including intermittently monitoring thyroid status

## 2019-12-14 ENCOUNTER — Encounter: Payer: Self-pay | Admitting: Internal Medicine

## 2019-12-17 DIAGNOSIS — I723 Aneurysm of iliac artery: Secondary | ICD-10-CM | POA: Diagnosis not present

## 2019-12-17 DIAGNOSIS — Z299 Encounter for prophylactic measures, unspecified: Secondary | ICD-10-CM | POA: Diagnosis not present

## 2019-12-17 DIAGNOSIS — R69 Illness, unspecified: Secondary | ICD-10-CM | POA: Diagnosis not present

## 2019-12-17 DIAGNOSIS — Z6835 Body mass index (BMI) 35.0-35.9, adult: Secondary | ICD-10-CM | POA: Diagnosis not present

## 2019-12-17 DIAGNOSIS — I10 Essential (primary) hypertension: Secondary | ICD-10-CM | POA: Diagnosis not present

## 2019-12-17 DIAGNOSIS — J449 Chronic obstructive pulmonary disease, unspecified: Secondary | ICD-10-CM | POA: Diagnosis not present

## 2019-12-31 DIAGNOSIS — G4730 Sleep apnea, unspecified: Secondary | ICD-10-CM | POA: Diagnosis not present

## 2020-01-17 DIAGNOSIS — R69 Illness, unspecified: Secondary | ICD-10-CM | POA: Diagnosis not present

## 2020-01-20 ENCOUNTER — Telehealth: Payer: Self-pay | Admitting: Internal Medicine

## 2020-01-20 ENCOUNTER — Other Ambulatory Visit: Payer: Self-pay | Admitting: Internal Medicine

## 2020-01-20 DIAGNOSIS — J449 Chronic obstructive pulmonary disease, unspecified: Secondary | ICD-10-CM

## 2020-01-20 NOTE — Telephone Encounter (Signed)
Lm for patient.  

## 2020-01-21 ENCOUNTER — Other Ambulatory Visit: Payer: Self-pay

## 2020-01-21 ENCOUNTER — Ambulatory Visit: Payer: Medicare HMO | Admitting: Internal Medicine

## 2020-01-21 ENCOUNTER — Encounter: Payer: Self-pay | Admitting: Internal Medicine

## 2020-01-21 ENCOUNTER — Ambulatory Visit (HOSPITAL_COMMUNITY)
Admission: RE | Admit: 2020-01-21 | Discharge: 2020-01-21 | Disposition: A | Discharge status: Home / Self Care | Payer: Medicare HMO | Source: Ambulatory Visit | Attending: Internal Medicine | Admitting: Internal Medicine

## 2020-01-21 DIAGNOSIS — J449 Chronic obstructive pulmonary disease, unspecified: Secondary | ICD-10-CM

## 2020-01-21 DIAGNOSIS — R079 Chest pain, unspecified: Secondary | ICD-10-CM | POA: Diagnosis not present

## 2020-01-21 DIAGNOSIS — R059 Cough, unspecified: Secondary | ICD-10-CM | POA: Diagnosis not present

## 2020-01-21 DIAGNOSIS — J90 Pleural effusion, not elsewhere classified: Secondary | ICD-10-CM | POA: Diagnosis not present

## 2020-01-21 MED ORDER — TRELEGY ELLIPTA 100-62.5-25 MCG/INH IN AEPB: 1.0000 | INHALATION_SPRAY | Freq: Every day | RESPIRATORY_TRACT | 0 refills | Status: DC

## 2020-01-21 NOTE — Assessment & Plan Note (Addendum)
Quit smoking 09/2015 PFT's April 28, 2010 FEV1 1.03 (33%) ratio 45 and no better p B2, DLC0 65% - Stopped working Apr 21 2015  - 08/31/2015   try add spiriva resp to sym 160  - 08/31/2015   Walked RA x one lap @ 185 stopped due to  Sob, nl pace, no desat  PFT's  09/02/15 FEV1 0.88(32%) ratio 43 p 15% improvement from saba p symbicort 160 / spiriva prior ith DLCO  38 corrects to 81 for alv volume   - 09/05/2015 completed disability paperwork  - 09/28/2015 completed additional disability paperwork  - 10/23/2019  After extensive coaching inhaler device,  effectiveness =    50% > change breo to breztri  - PFT's 11/25/19   FEV1 0.81 (31 % ) ratio 0.51  p 0 % improvement from saba p  "3 inhalers"   prior to study with DLCO  13.48 (56%) corrects to 4.35 (105%)  for alv volume and FV curve classic severe concavity   -  12/11/2019   Walked RA  approx   400 ft  @ moderat pace  stopped due to  End of study c/o sob with sats still    Feet with sats still 96%  - 01/21/2020  After extensive coaching inhaler device,  effectiveness =    90% p coaching with dpi > try trelegy x 2 weeks as still too saba dep on breztri    Group D in terms of symptom/risk and laba/lama/ICS  therefore appropriate rx at this point >>>  trelegy or breztri approp here and he would like to try the trelegy with prn saba and see if can reduce his saba dependency   Advised; I spent extra time with pt today reviewing appropriate use of albuterol for prn use on exertion with the following points: 1) saba is for relief of sob that does not improve by walking a slower pace or resting but rather if the pt does not improve after trying this first. 2) If the pt is convinced, as many are, that saba helps recover from activity faster then it's easy to tell if this is the case by re-challenging : ie stop, take the inhaler, then p 5 minutes try the exact same activity (intensity of workload) that just caused the symptoms and see if they are substantially  diminished or not after saba 3) if there is an activity that reproducibly causes the symptoms, try the saba 15 min before the activity on alternate days   If in fact the saba really does help, then fine to continue to use it prn but advised may need to look closer at the maintenance regimen being used to achieve better control of airways disease with exertion.          Each maintenance medication was reviewed in detail including emphasizing most importantly the difference between maintenance and prns and under what circumstances the prns are to be triggered using an action plan format where appropriate.  Total time for H and P, chart review, counseling, teaching new device and generating customized AVS unique to this office visit / charting >  30 min

## 2020-01-21 NOTE — Patient Instructions (Signed)
Plan A = Automatic = Always=    Trelegy one click each am   Plan B = Backup (to supplement plan A, not to replace it) Only use your albuterol inhaler as a rescue medication to be used if you can't catch your breath by resting or doing a relaxed purse lip breathing pattern.  - The less you use it, the better it will work when you need it. - Ok to use the inhaler up to 2 puffs  every 4 hours if you must but call for appointment if use goes up over your usual need - Don't leave home without it !!  (think of it like the spare tire for your car)   Plan C = Crisis (instead of Plan B but only if Plan B stops working) - only use your albuterol nebulizer if you first try Plan B and it fails to help > ok to use the nebulizer up to every 4 hours but if start needing it regularly call for immediate appointment  Try albuterol 15 min before an activity that you know would make you short of breath and see if it makes any difference and if makes none then don't take it after activity unless you can't catch your breath.       Please schedule a follow up visit in 3 months but call sooner if needed

## 2020-01-21 NOTE — Progress Notes (Addendum)
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 Age 64 ? Dx ? And reports  flunked pfts for job around 2000 and on disability and since then  dx as copd in [place] 2011 p seen in pulmonary clinic 04/28/10 and referred back by Sean Ramos 08/31/2015  With gold III cpd criteria in Jan 2012     History of Present Illness  April 28, 2010 1st pulmonary office eval ov cc doe and cough onset in 40's and progressed to point where doe shopping leans on cart and struggles to get groceries back to his house (lives next to store). has noct wheeze disturbs sleep. can't tell if dulera helpiing at this point not really using it. Dx gold III/IV copd  rec dulera 100 2bid / prn saba  Stop smoking   08/31/2015 1st EPIC  [facility] office visit//  Sean Ramos  On symbicort 160 poor hfa  Chief Complaint  Patient presents with  . Pulmonary Consult    Referred by Sean. Manuella Ramos. Pt seen here in the past- last in 2012. He states that his breathing has been worse over the past few months. He states that he gets SOB walking "not far" and also when he gets upset about something.   wakes up coughing/ congested each am/bad days  can't walk across house s sob/other days does ok very slow to mailbox slt up hill back to house. Sob at rest when gets upset / some better with saba hfa and neb  rec Plan A = Automatic = Symbicort 160 Take 2 puffs first thing in am and then another 2 puffs about 12 hours later.  Spiriva 2 pffs each am (like high octane)  Plan B = Backup Only use your albuterol (proair) as a rescue medication  Plan C = Crisis - only use your albuterol nebulizer if you first try Plan B and it fails  Think of the e cigs as a one way Ramos off all tobacco products      09/28/2015  f/u ov/Sean Ramos re: GOLDI III/IV copd/ still smoking rx  Symbicort/ spiriva respimat  02 prn   Chief Complaint  Patient presents with  . Follow-up    Breathing is unchanged. No new co's today. He is using proair 1 x daily on average and neb 2 x daily on average.   MMRC3  = can't walk 100 yards even at a slow pace at a flat grade s stopping due to sob  rec  Plan A = Automatic = Symbicort 160 Take 2 puffs first thing in am and then another 2 puffs about 12 hours later.                                      Spiriva 2 pffs each am (like high octane fuel)  Plan B = Backup Only use your albuterol (proair) as a rescue medication   Plan C = Crisis - only use your albuterol nebulizer if you first try Plan B     10/23/2019  f/u ov/Sean Ramos re: GOLD III/ IV  -self referral / smoking e cigs and maint breo 100 / re-establish Chief Complaint  Patient presents with  . Consult    Patient last  seen in 2017. Patient has COPD and shortness of breath all the time exertion makes it worse. Has a cough but can't get up mucus.   Dyspnea:  mb is now a struggle/and back about 100 ft  Cough: some am cough /congestion > minimal mucoid   Sleeping: flat bed with pillows  SABA use: once a week / very rarely neb  02: none  rec I do recommned low dose Chest CT for the next 11 years  - contract Sean Form NP at the [place] office to arrange  Wean off vapes if possible  Plan A = Automatic = Always=   Breztri Take 2 puffs first thing in am and then another 2 puffs about 12 hours later.  Work on inhaler technique:  Plan B = Backup (to supplement plan A, not to replace it) Only use your albuterol (ventolin)  inhaler Plan C = Crisis (instead of Plan B but only if Plan B stops working) - only use your albuterol nebulizer if you first try Plan B and it fails to help > ok to use the nebulizer up to every 4 hours but if start needing it regularly call for immediate appointment Try albuterol 15 min before an activity that you know would make you short of breath and see if it makes any difference and if makes none then  don't take it after activity unless you can't catch your breath.    Please schedule a follow up office visit in 6-8  Weeks with PFTS with breztri x 2 puffs that am  = GOLD 3 p "3 inhalers"    12/11/2019  f/u ov/Sean Ramos office/Sean Ramos re: GOLD 3  Chief Complaint  Patient presents with  . Follow-up    non-productive cough, shortness of breath with activity  Dyspnea:  No change mailbox and back, never pre challenges or rechallenges  Cough: dry cough Sleeping: poorly but not related to resp dz  SABA use: 2-3 x  02: None    01/21/2020  f/u ov/[place] office/Sean Ramos re: copd  GOLD III/ breztri/ still vapes Chief Complaint  Patient presents with  . Follow-up    Breathing is about the same since the last visit. He is using his albuterol inhaler 3 x per day on average.   Dyspnea:  mb and back is easier Cough: none  Sleeping: no resp problem on flat bed x 3 pillows  SABA use: way too much  02: none    No obvious day to day or daytime variability or assoc excess/ purulent sputum or mucus plugs or hemoptysis or cp or chest tightness, subjective wheeze or overt sinus or hb symptoms.   Sleeping  without nocturnal  or early am exacerbation  of respiratory  c/o's or need for noct saba. Also denies any obvious fluctuation of symptoms with weather or environmental changes or other aggravating or alleviating factors except as outlined above   No unusual exposure hx or h/o childhood pna/ asthma or knowledge of premature birth.  Current Allergies, Complete Past Medical History, Past Surgical History, Family History, and Social History were reviewed in [employer] record.  ROS  The following are not active complaints unless bolded Hoarseness, sore throat, dysphagia, dental problems, itching, sneezing,  nasal congestion or discharge of excess mucus or purulent secretions, ear ache,   fever, chills, sweats, unintended wt loss or wt gain, classically pleuritic or exertional cp,   orthopnea pnd or arm/hand swelling  or leg swelling, presyncope, palpitations, abdominal pain, anorexia, nausea, vomiting, diarrhea  or change in bowel habits or change in bladder habits, change in stools or change in urine, dysuria, hematuria,  rash, arthralgias, visual complaints, headache, numbness, weakness or ataxia or problems with walking or coordination,  change in mood or  memory.        Current Meds  Medication Sig  . albuterol (PROAIR HFA) 108 (90 Base) MCG/ACT inhaler Inhale 2 puffs into the lungs every 6 (six) hours as needed for wheezing or shortness of breath.  Marland Kitchen aspirin 81 MG tablet Take 81 mg by mouth daily.  . clonazePAM (KLONOPIN) 0.5 MG tablet Take 0.5 mg by mouth 3 (three) times daily as needed for anxiety.  . Fluticasone-Umeclidin-Vilant (TRELEGY ELLIPTA) 100-62.5-25 MCG/INH AEPB Inhale 1 puff into the lungs daily.  . hydrochlorothiazide (HYDRODIURIL) 25 MG tablet Take 1 tablet by mouth daily.  Marland Kitchen ipratropium-albuterol (DUONEB) 0.5-2.5 (3) MG/3ML SOLN Take 3 mLs by nebulization every 4 (four) hours as needed.  Marland Kitchen NIFEdipine (PROCARDIA XL/ADALAT-CC) 60 MG 24 hr tablet Take 60 mg by mouth daily.  Marland Kitchen omega-3 acid ethyl esters (LOVAZA) 1 g capsule Take 1 g by mouth daily.  . potassium chloride SA (K-DUR,KLOR-CON) 20 MEQ tablet Take 20 mEq by mouth daily.  . simvastatin (ZOCOR) 20 MG tablet Take 20 mg by mouth daily.  Marland Kitchen terazosin (HYTRIN) 5 MG capsule Take 5 mg by mouth at bedtime.  Marland Kitchen tiZANidine (ZANAFLEX) 4 MG tablet Take 4 mg by mouth every 6 (six) hours as needed for muscle spasms.  . valsartan-hydrochlorothiazide (DIOVAN-HCT) 160-25 MG tablet Take 1 tablet by mouth daily.               Objective:   Physical Exam  Obese amb bm nad   01/21/2020     218  12/11/2019       215  10/23/2019       215   09/28/15 198 lb (89.812 kg)  08/31/15 197 lb (89.359 kg)  04/28/10 200 lb 8 oz (90.946 kg)      Vital signs reviewed  01/21/2020  - Note at rest 02 sats  96% on RA      HEENT : pt wearing mask not removed for exam due to covid -19 concerns.    NECK :  without JVD/Nodes/TM/ nl carotid upstrokes bilaterally   LUNGS: no acc muscle use,  Mod barrel  contour chest wall with bilateral  Distant bs s audible wheeze and  without cough on insp or exp maneuvers and mod  Hyperresonant  to  percussion bilaterally     CV:  RRR  no s3 or murmur or increase in P2, and no edema   ABD:  soft and nontender with pos mid insp Hoover's  in the supine position. No bruits or organomegaly appreciated, bowel sounds nl  MS:     ext warm without deformities, calf tenderness, cyanosis or clubbing No obvious joint restrictions   SKIN: warm and dry without lesions    NEURO:  alert, approp, nl sensorium with  no motor or cerebellar deficits apparent.        CXR PA and Lateral:   01/21/2020 :    I personally reviewed images and agree with radiology impression as follows:    1. Very mild left basilar atelectasis and/or early infiltrate. 2. Small, stable right pleural effusion. My  Impression:  No significant change from priors      Assessment:

## 2020-01-22 NOTE — Telephone Encounter (Signed)
Lm x2 for patient. Letter has been mailed to address on file per office protocol.   

## 2020-01-25 ENCOUNTER — Other Ambulatory Visit: Payer: Self-pay | Admitting: *Deleted

## 2020-01-25 DIAGNOSIS — J449 Chronic obstructive pulmonary disease, unspecified: Secondary | ICD-10-CM

## 2020-01-25 DIAGNOSIS — R69 Illness, unspecified: Secondary | ICD-10-CM | POA: Diagnosis not present

## 2020-01-25 MED ORDER — AZITHROMYCIN 250 MG PO TABS: 250.0000 mg | ORAL_TABLET | ORAL | 0 refills | Status: DC

## 2020-01-25 NOTE — Progress Notes (Signed)
Spoke with pt and notified of results per Dr. Melvyn Novas. Pt verbalized understanding and denied any questions. Zpack sent  Cxr results

## 2020-01-26 ENCOUNTER — Telehealth: Payer: Self-pay | Admitting: Internal Medicine

## 2020-01-26 MED ORDER — AZITHROMYCIN 250 MG PO TABS
250.0000 mg | ORAL_TABLET | ORAL | 0 refills | Status: DC
Start: 2020-01-26 — End: 2020-08-26

## 2020-01-26 NOTE — Telephone Encounter (Signed)
Called and spoke to patient, who is requesting clarification on abx. I have made patient aware that zpak was sent in for possible early PNA.  Patient stated that he was given nebulizer solution from pharmacy instead of zpak. I have spoken to [facility] with [facility], who states that Rx for zpak has not been picked up.  Called back and let patient know that Rx is ready for pick up at [facility].  [facility], as [facility] preferred [facility].  [facility]  Contacted [facility] and requested to cancel Rx for zpak. Nothing further needed.

## 2020-01-29 ENCOUNTER — Telehealth: Payer: Self-pay | Admitting: Internal Medicine

## 2020-01-29 MED ORDER — PREDNISONE 10 MG PO TABS: ORAL_TABLET | ORAL | 0 refills | Status: DC

## 2020-01-29 NOTE — Telephone Encounter (Signed)
Spoke with patient. He verbalized understanding of MW's recs. Will go ahead and send in prednisone taper for him.   Nothing further needed at time of call.

## 2020-01-29 NOTE — Telephone Encounter (Signed)
Primary Pulmonologist: MW Last office visit and with whom: 01/21/20 with MW What do we see them for (pulmonary problems):  COPD Last OV assessment/plan: Quit smoking 09/2015 PFT's April 28, 2010 FEV1 1.03 (33%) ratio 45 and no better p B2, DLC0 65% - Stopped working Apr 21 2015  - 08/31/2015   try add spiriva resp to sym 160  - 08/31/2015   Walked RA x one lap @ 185 stopped due to  Sob, nl pace, no desat  PFT's  09/02/15 FEV1 0.88(32%) ratio 43 p 15% improvement from saba p symbicort 160 / spiriva prior ith DLCO  38 corrects to 81 for alv volume   - 09/05/2015 completed disability paperwork  - 09/28/2015 completed additional disability paperwork  - 10/23/2019  After extensive coaching inhaler device,  effectiveness =    50% > change breo to breztri  - PFT's 11/25/19   FEV1 0.81 (31 % ) ratio 0.51  p 0 % improvement from saba p  "3 inhalers"   prior to study with DLCO  13.48 (56%) corrects to 4.35 (105%)  for alv volume and FV curve classic severe concavity   -  12/11/2019   Walked RA  approx   400 ft  @ moderat pace  stopped due to  End of study c/o sob with sats still    Feet with sats still 96%  - 01/21/2020  After extensive coaching inhaler device,  effectiveness =    90% p coaching with dpi > try trelegy x 2 weeks as still too saba dep on breztri    Group D in terms of symptom/risk and laba/lama/ICS  therefore appropriate rx at this point >>>  trelegy or breztri approp here and he would like to try the trelegy with prn saba and see if can reduce his saba dependency   Advised; I spent extra time with pt today reviewing appropriate use of albuterol for prn use on exertion with the following points: 1) saba is for relief of sob that does not improve by walking a slower pace or resting but rather if the pt does not improve after trying this first. 2) If the pt is convinced, as many are, that saba helps recover from activity faster then it's easy to tell if this is the case by re-challenging : ie  stop, take the inhaler, then p 5 minutes try the exact same activity (intensity of workload) that just caused the symptoms and see if they are substantially diminished or not after saba 3) if there is an activity that reproducibly causes the symptoms, try the saba 15 min before the activity on alternate days   If in fact the saba really does help, then fine to continue to use it prn but advised may need to look closer at the maintenance regimen being used to achieve better control of airways disease with exertion.          Each maintenance medication was reviewed in detail including emphasizing most importantly the difference between maintenance and prns and under what circumstances the prns are to be triggered using an action plan format where appropriate.  Total time for H and P, chart review, counseling, teaching new device and generating customized AVS unique to this office visit / charting >  30 min         Was appointment offered to patient (explain)?  No, patient waiting recommendations   Reason for call: Called and spoke with patient. He stated that he has one dose of the  zpak left. He started feeling congested today. He is unable to cough much phlegm but when he does, it is clear. Denies any fevers but does have body aches and chills. Also increased wheezing. Denies being around sick within the last few days.   He has received both of his covid vaccines.   He wants to know if MW has any recommendations for him.   [facility] [place]   No Known Allergies  Immunization History  Administered Date(s) Administered  . Influenza, High Dose Seasonal PF 01/17/2020  . Moderna SARS-COVID-2 Vaccination 06/11/2019, 07/14/2019  . Pneumococcal Conjugate-13 01/17/2020    MW, please advise. Thanks!

## 2020-01-29 NOTE — Telephone Encounter (Signed)
It's probably  trelegy side effects > go back to breztri Take 2 puffs first thing in am and then another 2 puffs about 12 hours later.      No abx needed  If not improving can try Prednisone 10 mg take  4 each am x 2 days,   2 each am x 2 days,  1 each am x 2 days and stop (call it in now so he'll have it if needed)

## 2020-02-08 ENCOUNTER — Ambulatory Visit (HOSPITAL_COMMUNITY)
Admission: RE | Admit: 2020-02-08 | Discharge: 2020-02-08 | Disposition: A | Discharge status: Home / Self Care | Payer: Medicare HMO | Source: Ambulatory Visit | Attending: Internal Medicine | Admitting: Internal Medicine

## 2020-02-08 ENCOUNTER — Other Ambulatory Visit: Payer: Self-pay

## 2020-02-08 DIAGNOSIS — J439 Emphysema, unspecified: Secondary | ICD-10-CM | POA: Diagnosis not present

## 2020-02-08 DIAGNOSIS — J449 Chronic obstructive pulmonary disease, unspecified: Secondary | ICD-10-CM | POA: Diagnosis not present

## 2020-02-08 DIAGNOSIS — J189 Pneumonia, unspecified organism: Secondary | ICD-10-CM | POA: Diagnosis not present

## 2020-02-08 DIAGNOSIS — J90 Pleural effusion, not elsewhere classified: Secondary | ICD-10-CM | POA: Diagnosis not present

## 2020-02-09 DIAGNOSIS — Z9889 Other specified postprocedural states: Secondary | ICD-10-CM | POA: Diagnosis not present

## 2020-02-09 DIAGNOSIS — I10 Essential (primary) hypertension: Secondary | ICD-10-CM | POA: Diagnosis not present

## 2020-02-09 DIAGNOSIS — Z6836 Body mass index (BMI) 36.0-36.9, adult: Secondary | ICD-10-CM | POA: Diagnosis not present

## 2020-02-09 NOTE — Progress Notes (Signed)
Spoke with pt and notified of results per Dr. Wert. Pt verbalized understanding and denied any questions. 

## 2020-02-10 ENCOUNTER — Ambulatory Visit (INDEPENDENT_AMBULATORY_CARE_PROVIDER_SITE_OTHER): Payer: Medicare HMO | Admitting: Internal Medicine

## 2020-02-10 ENCOUNTER — Encounter: Payer: Self-pay | Admitting: Internal Medicine

## 2020-02-10 ENCOUNTER — Other Ambulatory Visit: Payer: Self-pay

## 2020-02-10 DIAGNOSIS — J449 Chronic obstructive pulmonary disease, unspecified: Secondary | ICD-10-CM | POA: Diagnosis not present

## 2020-02-10 NOTE — Patient Instructions (Addendum)
I can't evaluate this problem over the phone as you may have covid infection and I suggest you go to an urgent care or ER to be evaluated.  No change in pulmonary medications  Return to  [place] office in 3 weeks  4 weeks, sooner if needed  with all medications /inhalers/ solutions in hand so we can verify exactly what you are taking. This includes all medications from all doctors and over the counters

## 2020-02-10 NOTE — Assessment & Plan Note (Signed)
Quit smoking 09/2015 PFT's April 28, 2010 FEV1 1.03 (33%) ratio 45 and no better p B2, DLC0 65% - Stopped working Apr 21 2015  - 08/31/2015   try add spiriva resp to sym 160  - 08/31/2015   Walked RA x one lap @ 185 stopped due to  Sob, nl pace, no desat  PFT's  09/02/15 FEV1 0.88(32%) ratio 43 p 15% improvement from saba p symbicort 160 / spiriva prior ith DLCO  38 corrects to 81 for alv volume   - 09/05/2015 completed disability paperwork  - 09/28/2015 completed additional disability paperwork  - 10/23/2019  After extensive coaching inhaler device,  effectiveness =    50% > change breo to breztri  - PFT's 11/25/19   FEV1 0.81 (31 % ) ratio 0.51  p 0 % improvement from saba p  "3 inhalers"   prior to study with DLCO  13.48 (56%) corrects to 4.35 (105%)  for alv volume and FV curve classic severe concavity   -  12/11/2019   Walked RA  approx   400 ft  @ moderat pace  stopped due to  End of study c/o sob with sats still    Feet with sats still 96%  - 01/21/2020  After extensive coaching inhaler device,  effectiveness =    90% p coaching with dpi > try trelegy x 2 weeks as still too saba dep on breztri > no better on trelegy so back on Breztri  No flare in copd symptoms despite "chest congestion and body aches ? With fever"   Rec:  Needs UC or ER eval for ? covid in vaccinated adult  No change in pulmonary meds  F/u in 3 weeks at [facility] office   Each maintenance medication was reviewed in detail including most importantly the difference between maintenance and as needed and under what circumstances the prns are to be used.  Please see AVS for specific  Instructions which are unique to this visit and I personally typed out  which were reviewed in detail over the phone with the patient and a copy provided mail

## 2020-02-10 NOTE — Progress Notes (Signed)
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 Age 64 ? Dx ? And reports  flunked pfts for job around 2000 and on disability and since then  dx as copd in [place] 2011 p seen in pulmonary clinic 04/28/10 and referred back by Dr Anthonette Legato 08/31/2015  With gold III cpd criteria in Jan 2012     History of Present Illness  April 28, 2010 1st pulmonary office eval ov cc doe and cough onset in 40's and progressed to point where doe shopping leans on cart and struggles to get groceries back to his house (lives next to store). has noct wheeze disturbs sleep. can't tell if dulera helpiing at this point not really using it. Dx gold III/IV copd  rec dulera 100 2bid / prn saba  Stop smoking   08/31/2015 1st EPIC  [facility] office visit//  Cailin Gebel  On symbicort 160 poor hfa  Chief Complaint  Patient presents with  . Pulmonary Consult    Referred by Dr. Manuella Ghazi. Pt seen here in the past- last in 2012. He states that his breathing has been worse over the past few months. He states that he gets SOB walking "not far" and also when he gets upset about something.   wakes up coughing/ congested each am/bad days  can't walk across house s sob/other days does ok very slow to mailbox slt up hill back to house. Sob at rest when gets upset / some better with saba hfa and neb  rec Plan A = Automatic = Symbicort 160 Take 2 puffs first thing in am and then another 2 puffs about 12 hours later.  Spiriva 2 pffs each am (like high octane)  Plan B = Backup Only use your albuterol (proair) as a rescue medication  Plan C = Crisis - only use your albuterol nebulizer if you first try Plan B and it fails  Think of the e cigs as a one way bridge off all tobacco products      09/28/2015  f/u ov/Lanis Storlie re: GOLDI III/IV copd/ still smoking rx  Symbicort/ spiriva respimat  02 prn   Chief Complaint  Patient presents with  . Follow-up    Breathing is unchanged. No new co's today. He is using proair 1 x daily on average and neb 2 x daily on average.   MMRC3  = can't walk 100 yards even at a slow pace at a flat grade s stopping due to sob  rec  Plan A = Automatic = Symbicort 160 Take 2 puffs first thing in am and then another 2 puffs about 12 hours later.                                      Spiriva 2 pffs each am (like high octane fuel)  Plan B = Backup Only use your albuterol (proair) as a rescue medication   Plan C = Crisis - only use your albuterol nebulizer if you first try Plan B     10/23/2019  f/u ov/Gizel Riedlinger re: GOLD III/ IV  -self referral / smoking e cigs and maint breo 100 / re-establish Chief Complaint  Patient presents with  . Consult    Patient last  seen in 2017. Patient has COPD and shortness of breath all the time exertion makes it worse. Has a cough but can't get up mucus.   Dyspnea:  mb is now a struggle/and back about 100 ft  Cough: some am cough /congestion > minimal mucoid   Sleeping: flat bed with pillows  SABA use: once a week / very rarely neb  02: none  rec I do recommned low dose Chest CT for the next 11 years  - contract [name] NP at the [place] office to arrange  Wean off vapes if possible  Plan A = Automatic = Always=   Breztri Take 2 puffs first thing in am and then another 2 puffs about 12 hours later.  Work on inhaler technique:  Plan B = Backup (to supplement plan A, not to replace it) Only use your albuterol (ventolin)  inhaler Plan C = Crisis (instead of Plan B but only if Plan B stops working) - only use your albuterol nebulizer if you first try Plan B and it fails to help > ok to use the nebulizer up to every 4 hours but if start needing it regularly call for immediate appointment Try albuterol 15 min before an activity that you know would make you short of breath and see if it makes any difference and if makes none then  don't take it after activity unless you can't catch your breath.    Please schedule a follow up office visit in 6-8  Weeks with PFTS with breztri x 2 puffs that am  = GOLD 3 p "3 inhalers"      01/21/2020  f/u ov/[place] [facility]/Tien Aispuro re: copd  GOLD III/ breztri/ still vapes Chief Complaint  Patient presents with  . Follow-up    Breathing is about the same since the last visit. He is using his albuterol inhaler 3 x per day on average.   Dyspnea:  mb and back is easier Cough: none  Sleeping: no resp problem on flat bed x 3 pillows  SABA use: way too much  02: none  rec Plan A = Automatic = Always=    Trelegy one click each am  Plan B = Backup (to supplement plan A, not to replace it) Only use your albuterol inhaler as a rescue medication Plan C = Crisis (instead of Plan B but only if Plan B stops working) - only use your albuterol nebulizer if you first try Plan B  Try albuterol 15 min before an activity Add Zpak as radiology concerned ? pna but note no symptoms   01/29/20 phone call re congestion" has developed since on zpak and now aches and chills but mucus clear so rec: go back to breztri 2 bid  No abx needed Prednisone 10 mg take  4 each am x 2 days,   2 each am x 2 days,  1 each am x 2 days and stop  cxr  02/08/20  No pna  Virtual Visit via Telephone Note 02/10/2020   I connected with Sean Ramos on 02/10/20 at 825 AM    by telephone and verified that I am speaking with the correct person using two identifiers. Pt is at home and this call made from my office with no other participants    I discussed the limitations, risks, security and privacy concerns of performing an evaluation and management service by telephone and the availability of in person appointments. I also discussed with the patient that there may be a patient responsible  charge related to this service. The patient expressed understanding and agreed to proceed.   History of Present Illness:   New  onset fever and chills ? p flu/pneumovax  [facility] > looks like onset was on last day of zpak rx which was called in due to radiology concerns but no pna on 11/8 on f/u  "in bed ever since"  Dyspnea:  Breathing about the same  Positional cp / not turly pleuritic Cough: no sputum Sleeping: no respiratory symptoms flat bed 3 pillows  SABA use: not as much 02: none    No obvious day to day or daytime variability or assoc excess/ purulent sputum or mucus plugs or hemoptysis or  chest tightness, subjective wheeze or overt sinus or hb symptoms.    Also denies any obvious fluctuation of symptoms with weather or environmental changes or other aggravating or alleviating factors except as outlined above.   Meds reviewed/ med reconciliation completed     No outpatient medications have been marked as taking for the 02/10/20 encounter (Appointment) with Tanda Rockers, MD.         Observations/Objective: Good voice texture, speaking in full sentences , no spont cough     I personally reviewed images and agree with radiology impression as follows:  CXR:   02/08/20 1. Interval resolution of previously described left basilar opacities. No consolidative opacities suggestive of infectious etiology. 2. Similar appearing right basilar scarring and postsurgical changes. 3.  Emphysema   Assessment and Plan: See problem list for active a/p's   Follow Up Instructions: See avs for instructions unique to this ov which includes revised/ updated med list     I discussed the assessment and treatment plan with the patient. The patient was provided an opportunity to ask questions and all were answered. The patient agreed with the plan and demonstrated an understanding of the instructions.   The patient was advised to call back or seek an in-person evaluation if the symptoms worsen or if the condition fails to improve as anticipated.  I provided 25 minutes of non-face-to-face time during this  encounter.   Christinia Gully, MD

## 2020-02-11 ENCOUNTER — Other Ambulatory Visit: Payer: Self-pay

## 2020-02-11 ENCOUNTER — Other Ambulatory Visit: Payer: Medicare HMO

## 2020-02-11 DIAGNOSIS — Z20822 Contact with and (suspected) exposure to covid-19: Secondary | ICD-10-CM

## 2020-02-13 LAB — SARS-COV-2, NAA 2 DAY TAT

## 2020-02-13 LAB — NOVEL CORONAVIRUS, NAA: SARS-CoV-2, NAA: NOT DETECTED

## 2020-03-24 DIAGNOSIS — Z299 Encounter for prophylactic measures, unspecified: Secondary | ICD-10-CM | POA: Diagnosis not present

## 2020-03-24 DIAGNOSIS — R69 Illness, unspecified: Secondary | ICD-10-CM | POA: Diagnosis not present

## 2020-03-24 DIAGNOSIS — R7989 Other specified abnormal findings of blood chemistry: Secondary | ICD-10-CM | POA: Diagnosis not present

## 2020-03-24 DIAGNOSIS — I10 Essential (primary) hypertension: Secondary | ICD-10-CM | POA: Diagnosis not present

## 2020-03-24 DIAGNOSIS — M62838 Other muscle spasm: Secondary | ICD-10-CM | POA: Diagnosis not present

## 2020-04-04 DIAGNOSIS — Z299 Encounter for prophylactic measures, unspecified: Secondary | ICD-10-CM | POA: Diagnosis not present

## 2020-04-04 DIAGNOSIS — J449 Chronic obstructive pulmonary disease, unspecified: Secondary | ICD-10-CM | POA: Diagnosis not present

## 2020-04-04 DIAGNOSIS — J069 Acute upper respiratory infection, unspecified: Secondary | ICD-10-CM | POA: Diagnosis not present

## 2020-04-04 DIAGNOSIS — R69 Illness, unspecified: Secondary | ICD-10-CM | POA: Diagnosis not present

## 2020-04-04 DIAGNOSIS — F17210 Nicotine dependence, cigarettes, uncomplicated: Secondary | ICD-10-CM | POA: Diagnosis not present

## 2020-04-29 DIAGNOSIS — J440 Chronic obstructive pulmonary disease with acute lower respiratory infection: Secondary | ICD-10-CM | POA: Diagnosis not present

## 2020-04-29 DIAGNOSIS — Z299 Encounter for prophylactic measures, unspecified: Secondary | ICD-10-CM | POA: Diagnosis not present

## 2020-04-29 DIAGNOSIS — R69 Illness, unspecified: Secondary | ICD-10-CM | POA: Diagnosis not present

## 2020-04-29 DIAGNOSIS — F17210 Nicotine dependence, cigarettes, uncomplicated: Secondary | ICD-10-CM | POA: Diagnosis not present

## 2020-04-29 DIAGNOSIS — Z6835 Body mass index (BMI) 35.0-35.9, adult: Secondary | ICD-10-CM | POA: Diagnosis not present

## 2020-04-29 DIAGNOSIS — J209 Acute bronchitis, unspecified: Secondary | ICD-10-CM | POA: Diagnosis not present

## 2020-04-29 DIAGNOSIS — M549 Dorsalgia, unspecified: Secondary | ICD-10-CM | POA: Diagnosis not present

## 2020-05-03 DIAGNOSIS — J440 Chronic obstructive pulmonary disease with acute lower respiratory infection: Secondary | ICD-10-CM | POA: Diagnosis not present

## 2020-05-10 DIAGNOSIS — M549 Dorsalgia, unspecified: Secondary | ICD-10-CM | POA: Diagnosis not present

## 2020-05-10 DIAGNOSIS — F17210 Nicotine dependence, cigarettes, uncomplicated: Secondary | ICD-10-CM | POA: Diagnosis not present

## 2020-05-10 DIAGNOSIS — I639 Cerebral infarction, unspecified: Secondary | ICD-10-CM | POA: Diagnosis not present

## 2020-05-10 DIAGNOSIS — Z6835 Body mass index (BMI) 35.0-35.9, adult: Secondary | ICD-10-CM | POA: Diagnosis not present

## 2020-05-10 DIAGNOSIS — E7800 Pure hypercholesterolemia, unspecified: Secondary | ICD-10-CM | POA: Diagnosis not present

## 2020-05-10 DIAGNOSIS — I10 Essential (primary) hypertension: Secondary | ICD-10-CM | POA: Diagnosis not present

## 2020-05-10 DIAGNOSIS — R69 Illness, unspecified: Secondary | ICD-10-CM | POA: Diagnosis not present

## 2020-05-10 DIAGNOSIS — Z299 Encounter for prophylactic measures, unspecified: Secondary | ICD-10-CM | POA: Diagnosis not present

## 2020-05-16 DIAGNOSIS — M25511 Pain in right shoulder: Secondary | ICD-10-CM | POA: Diagnosis not present

## 2020-05-16 DIAGNOSIS — M25551 Pain in right hip: Secondary | ICD-10-CM | POA: Diagnosis not present

## 2020-05-16 DIAGNOSIS — I10 Essential (primary) hypertension: Secondary | ICD-10-CM | POA: Diagnosis not present

## 2020-05-16 DIAGNOSIS — Z299 Encounter for prophylactic measures, unspecified: Secondary | ICD-10-CM | POA: Diagnosis not present

## 2020-05-16 DIAGNOSIS — Z6834 Body mass index (BMI) 34.0-34.9, adult: Secondary | ICD-10-CM | POA: Diagnosis not present

## 2020-05-16 DIAGNOSIS — M25561 Pain in right knee: Secondary | ICD-10-CM | POA: Diagnosis not present

## 2020-05-30 DIAGNOSIS — F17210 Nicotine dependence, cigarettes, uncomplicated: Secondary | ICD-10-CM | POA: Diagnosis not present

## 2020-05-30 DIAGNOSIS — Z299 Encounter for prophylactic measures, unspecified: Secondary | ICD-10-CM | POA: Diagnosis not present

## 2020-05-30 DIAGNOSIS — R69 Illness, unspecified: Secondary | ICD-10-CM | POA: Diagnosis not present

## 2020-05-30 DIAGNOSIS — M545 Low back pain, unspecified: Secondary | ICD-10-CM | POA: Diagnosis not present

## 2020-05-30 DIAGNOSIS — Q7649 Other congenital malformations of spine, not associated with scoliosis: Secondary | ICD-10-CM | POA: Diagnosis not present

## 2020-05-30 DIAGNOSIS — J449 Chronic obstructive pulmonary disease, unspecified: Secondary | ICD-10-CM | POA: Diagnosis not present

## 2020-05-30 DIAGNOSIS — I10 Essential (primary) hypertension: Secondary | ICD-10-CM | POA: Diagnosis not present

## 2020-05-30 DIAGNOSIS — Z6834 Body mass index (BMI) 34.0-34.9, adult: Secondary | ICD-10-CM | POA: Diagnosis not present

## 2020-05-30 DIAGNOSIS — I723 Aneurysm of iliac artery: Secondary | ICD-10-CM | POA: Diagnosis not present

## 2020-06-01 DIAGNOSIS — Z6837 Body mass index (BMI) 37.0-37.9, adult: Secondary | ICD-10-CM | POA: Diagnosis not present

## 2020-06-01 DIAGNOSIS — Z0000 Encounter for general adult medical examination without abnormal findings: Secondary | ICD-10-CM | POA: Diagnosis not present

## 2020-06-01 DIAGNOSIS — I10 Essential (primary) hypertension: Secondary | ICD-10-CM | POA: Diagnosis not present

## 2020-06-01 DIAGNOSIS — Z1331 Encounter for screening for depression: Secondary | ICD-10-CM | POA: Diagnosis not present

## 2020-06-01 DIAGNOSIS — Z7189 Other specified counseling: Secondary | ICD-10-CM | POA: Diagnosis not present

## 2020-06-01 DIAGNOSIS — E7800 Pure hypercholesterolemia, unspecified: Secondary | ICD-10-CM | POA: Diagnosis not present

## 2020-06-01 DIAGNOSIS — M549 Dorsalgia, unspecified: Secondary | ICD-10-CM | POA: Diagnosis not present

## 2020-06-01 DIAGNOSIS — Z1339 Encounter for screening examination for other mental health and behavioral disorders: Secondary | ICD-10-CM | POA: Diagnosis not present

## 2020-06-01 DIAGNOSIS — Z299 Encounter for prophylactic measures, unspecified: Secondary | ICD-10-CM | POA: Diagnosis not present

## 2020-06-06 DIAGNOSIS — Z6836 Body mass index (BMI) 36.0-36.9, adult: Secondary | ICD-10-CM | POA: Diagnosis not present

## 2020-06-06 DIAGNOSIS — M549 Dorsalgia, unspecified: Secondary | ICD-10-CM | POA: Diagnosis not present

## 2020-06-06 DIAGNOSIS — R69 Illness, unspecified: Secondary | ICD-10-CM | POA: Diagnosis not present

## 2020-06-06 DIAGNOSIS — Z299 Encounter for prophylactic measures, unspecified: Secondary | ICD-10-CM | POA: Diagnosis not present

## 2020-06-06 DIAGNOSIS — E7800 Pure hypercholesterolemia, unspecified: Secondary | ICD-10-CM | POA: Diagnosis not present

## 2020-06-06 DIAGNOSIS — Z79899 Other long term (current) drug therapy: Secondary | ICD-10-CM | POA: Diagnosis not present

## 2020-06-06 DIAGNOSIS — Z0000 Encounter for general adult medical examination without abnormal findings: Secondary | ICD-10-CM | POA: Diagnosis not present

## 2020-06-06 DIAGNOSIS — Z125 Encounter for screening for malignant neoplasm of prostate: Secondary | ICD-10-CM | POA: Diagnosis not present

## 2020-06-06 DIAGNOSIS — F17210 Nicotine dependence, cigarettes, uncomplicated: Secondary | ICD-10-CM | POA: Diagnosis not present

## 2020-06-06 DIAGNOSIS — I10 Essential (primary) hypertension: Secondary | ICD-10-CM | POA: Diagnosis not present

## 2020-06-15 DIAGNOSIS — M5416 Radiculopathy, lumbar region: Secondary | ICD-10-CM | POA: Diagnosis not present

## 2020-06-15 DIAGNOSIS — Z79891 Long term (current) use of opiate analgesic: Secondary | ICD-10-CM | POA: Diagnosis not present

## 2020-06-17 DIAGNOSIS — M7138 Other bursal cyst, other site: Secondary | ICD-10-CM | POA: Diagnosis not present

## 2020-06-17 DIAGNOSIS — M47817 Spondylosis without myelopathy or radiculopathy, lumbosacral region: Secondary | ICD-10-CM | POA: Diagnosis not present

## 2020-06-17 DIAGNOSIS — G588 Other specified mononeuropathies: Secondary | ICD-10-CM | POA: Diagnosis not present

## 2020-06-17 DIAGNOSIS — M545 Low back pain, unspecified: Secondary | ICD-10-CM | POA: Diagnosis not present

## 2020-06-21 DIAGNOSIS — Z79891 Long term (current) use of opiate analgesic: Secondary | ICD-10-CM | POA: Diagnosis not present

## 2020-06-21 DIAGNOSIS — M5416 Radiculopathy, lumbar region: Secondary | ICD-10-CM | POA: Diagnosis not present

## 2020-06-24 DIAGNOSIS — N1831 Chronic kidney disease, stage 3a: Secondary | ICD-10-CM | POA: Diagnosis not present

## 2020-06-24 DIAGNOSIS — R69 Illness, unspecified: Secondary | ICD-10-CM | POA: Diagnosis not present

## 2020-06-24 DIAGNOSIS — I10 Essential (primary) hypertension: Secondary | ICD-10-CM | POA: Diagnosis not present

## 2020-06-24 DIAGNOSIS — G5621 Lesion of ulnar nerve, right upper limb: Secondary | ICD-10-CM | POA: Diagnosis not present

## 2020-06-24 DIAGNOSIS — Z299 Encounter for prophylactic measures, unspecified: Secondary | ICD-10-CM | POA: Diagnosis not present

## 2020-06-24 DIAGNOSIS — F17210 Nicotine dependence, cigarettes, uncomplicated: Secondary | ICD-10-CM | POA: Diagnosis not present

## 2020-06-24 DIAGNOSIS — Z6837 Body mass index (BMI) 37.0-37.9, adult: Secondary | ICD-10-CM | POA: Diagnosis not present

## 2020-07-08 DIAGNOSIS — M5416 Radiculopathy, lumbar region: Secondary | ICD-10-CM | POA: Diagnosis not present

## 2020-07-18 DIAGNOSIS — M5416 Radiculopathy, lumbar region: Secondary | ICD-10-CM | POA: Diagnosis not present

## 2020-07-25 DIAGNOSIS — D126 Benign neoplasm of colon, unspecified: Secondary | ICD-10-CM | POA: Diagnosis not present

## 2020-07-29 DIAGNOSIS — M5416 Radiculopathy, lumbar region: Secondary | ICD-10-CM | POA: Diagnosis not present

## 2020-07-29 DIAGNOSIS — M47816 Spondylosis without myelopathy or radiculopathy, lumbar region: Secondary | ICD-10-CM | POA: Diagnosis not present

## 2020-07-29 DIAGNOSIS — M7138 Other bursal cyst, other site: Secondary | ICD-10-CM | POA: Diagnosis not present

## 2020-08-08 DIAGNOSIS — M79631 Pain in right forearm: Secondary | ICD-10-CM | POA: Diagnosis not present

## 2020-08-08 DIAGNOSIS — R202 Paresthesia of skin: Secondary | ICD-10-CM | POA: Diagnosis not present

## 2020-08-08 DIAGNOSIS — M79641 Pain in right hand: Secondary | ICD-10-CM | POA: Diagnosis not present

## 2020-08-08 DIAGNOSIS — R531 Weakness: Secondary | ICD-10-CM | POA: Diagnosis not present

## 2020-08-11 DIAGNOSIS — Z7982 Long term (current) use of aspirin: Secondary | ICD-10-CM | POA: Diagnosis not present

## 2020-08-11 DIAGNOSIS — Z539 Procedure and treatment not carried out, unspecified reason: Secondary | ICD-10-CM | POA: Diagnosis not present

## 2020-08-11 DIAGNOSIS — Z7951 Long term (current) use of inhaled steroids: Secondary | ICD-10-CM | POA: Diagnosis not present

## 2020-08-11 DIAGNOSIS — J449 Chronic obstructive pulmonary disease, unspecified: Secondary | ICD-10-CM | POA: Diagnosis not present

## 2020-08-11 DIAGNOSIS — Z1211 Encounter for screening for malignant neoplasm of colon: Secondary | ICD-10-CM | POA: Diagnosis not present

## 2020-08-11 DIAGNOSIS — Z86010 Personal history of colonic polyps: Secondary | ICD-10-CM | POA: Diagnosis not present

## 2020-08-11 DIAGNOSIS — I10 Essential (primary) hypertension: Secondary | ICD-10-CM | POA: Diagnosis not present

## 2020-08-11 DIAGNOSIS — Z79899 Other long term (current) drug therapy: Secondary | ICD-10-CM | POA: Diagnosis not present

## 2020-08-11 DIAGNOSIS — Z8673 Personal history of transient ischemic attack (TIA), and cerebral infarction without residual deficits: Secondary | ICD-10-CM | POA: Diagnosis not present

## 2020-08-13 ENCOUNTER — Emergency Department (HOSPITAL_COMMUNITY)
Admission: EM | Admit: 2020-08-13 | Discharge: 2020-08-13 | Disposition: A | Discharge status: Home / Self Care | Payer: Medicare HMO | Attending: Emergency Medicine | Admitting: Emergency Medicine

## 2020-08-13 ENCOUNTER — Encounter (HOSPITAL_COMMUNITY): Payer: Self-pay

## 2020-08-13 ENCOUNTER — Other Ambulatory Visit: Payer: Self-pay

## 2020-08-13 DIAGNOSIS — M5441 Lumbago with sciatica, right side: Secondary | ICD-10-CM | POA: Diagnosis not present

## 2020-08-13 DIAGNOSIS — Z87891 Personal history of nicotine dependence: Secondary | ICD-10-CM | POA: Diagnosis not present

## 2020-08-13 DIAGNOSIS — J45909 Unspecified asthma, uncomplicated: Secondary | ICD-10-CM | POA: Diagnosis not present

## 2020-08-13 DIAGNOSIS — I10 Essential (primary) hypertension: Secondary | ICD-10-CM | POA: Diagnosis not present

## 2020-08-13 DIAGNOSIS — Z79899 Other long term (current) drug therapy: Secondary | ICD-10-CM | POA: Diagnosis not present

## 2020-08-13 DIAGNOSIS — J449 Chronic obstructive pulmonary disease, unspecified: Secondary | ICD-10-CM | POA: Insufficient documentation

## 2020-08-13 DIAGNOSIS — Z7951 Long term (current) use of inhaled steroids: Secondary | ICD-10-CM | POA: Insufficient documentation

## 2020-08-13 DIAGNOSIS — Z7982 Long term (current) use of aspirin: Secondary | ICD-10-CM | POA: Diagnosis not present

## 2020-08-13 DIAGNOSIS — M545 Low back pain, unspecified: Secondary | ICD-10-CM | POA: Diagnosis present

## 2020-08-13 DIAGNOSIS — M5431 Sciatica, right side: Secondary | ICD-10-CM | POA: Diagnosis not present

## 2020-08-13 MED ORDER — KETOROLAC TROMETHAMINE 60 MG/2ML IM SOLN
15.0000 mg | Freq: Once | INTRAMUSCULAR | Status: AC
  Administered 2020-08-13: 15 mg via INTRAMUSCULAR
  Filled 2020-08-13: qty 2

## 2020-08-13 NOTE — Discharge Instructions (Signed)
There is been a lot of research on back pain, unfortunately the only thing that seems to really help is Tylenol and ibuprofen.  Relative rest is also important to not lift greater than 10 pounds bending or twisting at the waist.  Please follow-up with your family physician.  The other thing that really seems to benefit patients is physical therapy which your doctor may send you for.  Please return to the emergency department for new numbness or weakness to your arms or legs. Difficulty with urinating or urinating or pooping on yourself.  Also if you cannot feel toilet paper when you wipe or get a fever.

## 2020-08-13 NOTE — ED Triage Notes (Signed)
Pt has chronic back pain, has copies of MRI with him. Pt says he just wants some relief. He says he doesn't want narcotics, but wants the pain to go away.

## 2020-08-13 NOTE — ED Provider Notes (Signed)
[facility] EMERGENCY DEPARTMENT Provider Note   CSN: [id number] Arrival date & time: 08/13/20  0947     History Chief Complaint  Patient presents with  . Back Pain    Sean Ramos is a 65 y.o. male.  65yo M with a chief complaint of low back pain.  This been an ongoing issue for him.  He has been seeing a specialist for this and has had injections.  He is frustrated because they do not perform surgery and he is asked to be referred to a surgeon has not yet been referred.  He had a recent prep for colonoscopy otherwise denies any issues with his bowels or bladder.  Denies any loss of peritoneal sensation denies numbness or weakness to the legs.  Denies any recent injury.  Denies fevers.  The history is provided by the patient.  Back Pain Location:  Lumbar spine Quality:  Aching Radiates to:  R foot Pain severity:  Severe Onset quality:  Gradual Duration:  12 months Timing:  Constant Progression:  Worsening Chronicity:  New Relieved by:  Nothing Worsened by:  Nothing Ineffective treatments:  None tried Associated symptoms: no abdominal pain, no chest pain, no fever and no headaches        Past Medical History:  Diagnosis Date  . Arthritis   . Asthma   . COPD (chronic obstructive pulmonary disease) ([place])   . Hyperlipidemia   . Hypertension   . Insomnia   . Stroke [facility])     Patient Active Problem List   Diagnosis Date Noted  . Morbid obesity due to excess calories ([place]) 09/01/2015  . CARCINOMA, SQUAMOUS CELL 04/28/2010  . Cigarette smoker 04/28/2010  . OBSTRUCTIVE SLEEP APNEA 04/28/2010  . Essential hypertension 04/28/2010  . COPD GOLD III   04/28/2010    Past Surgical History:  Procedure Laterality Date  . APPENDECTOMY         Family History  Problem Relation Age of Onset  . Heart attack Father   . Stroke Brother   . Stroke Brother     Social History   Tobacco Use  . Smoking status: Former Smoker    Packs/day: 2.00    Years: 46.00     Pack years: 92.00    Types: Cigarettes    Quit date: 09/17/2015    Years since quitting: 4.9  . Smokeless tobacco: Never Used  . Tobacco comment: vapes 12/11/2019  Vaping Use  . Vaping Use: Every day  Substance Use Topics  . Alcohol use: No    Alcohol/week: 0.0 standard drinks  . Drug use: No    Home Medications Prior to Admission medications   Medication Sig Start Date End Date Taking? Authorizing Provider  albuterol (PROAIR HFA) 108 (90 Base) MCG/ACT inhaler Inhale 2 puffs into the lungs every 6 (six) hours as needed for wheezing or shortness of breath.    [provider]  aspirin 81 MG tablet Take 81 mg by mouth daily.    [provider]  azithromycin (ZITHROMAX) 250 MG tablet Take 1 tablet (250 mg total) by mouth as directed. 01/26/20   Tanda Rockers, MD  clonazePAM (KLONOPIN) 0.5 MG tablet Take 0.5 mg by mouth 3 (three) times daily as needed for anxiety.    [provider]  Fluticasone-Umeclidin-Vilant (TRELEGY ELLIPTA) 100-62.5-25 MCG/INH AEPB Inhale 1 puff into the lungs daily. 12/11/19   Tanda Rockers, MD  Fluticasone-Umeclidin-Vilant (TRELEGY ELLIPTA) 100-62.5-25 MCG/INH AEPB Inhale 1 puff into the lungs daily. 01/21/20  Tanda Rockers, MD  Fluticasone-Umeclidin-Vilant (TRELEGY ELLIPTA) 100-62.5-25 MCG/INH AEPB Inhale 1 puff into the lungs daily. 01/21/20   Tanda Rockers, MD  hydrochlorothiazide (HYDRODIURIL) 25 MG tablet Take 1 tablet by mouth daily. 06/17/19   [provider]  ipratropium-albuterol (DUONEB) 0.5-2.5 (3) MG/3ML SOLN Take 3 mLs by nebulization every 4 (four) hours as needed.    [provider]  NIFEdipine (PROCARDIA XL/ADALAT-CC) 60 MG 24 hr tablet Take 60 mg by mouth daily.    [provider]  omega-3 acid ethyl esters (LOVAZA) 1 g capsule Take 1 g by mouth daily.    [provider]  potassium chloride SA (K-DUR,KLOR-CON) 20 MEQ tablet Take 20 mEq by mouth daily.    [provider]   predniSONE (DELTASONE) 10 MG tablet Take 4 tabs x 2 days, 2 tabs x 2 days, then 1 tab x 2 days and stop. 01/29/20   Tanda Rockers, MD  simvastatin (ZOCOR) 20 MG tablet Take 20 mg by mouth daily.    [provider]  terazosin (HYTRIN) 5 MG capsule Take 5 mg by mouth at bedtime.    [provider]  tiZANidine (ZANAFLEX) 4 MG tablet Take 4 mg by mouth every 6 (six) hours as needed for muscle spasms.    [provider]  valsartan-hydrochlorothiazide (DIOVAN-HCT) 160-25 MG tablet Take 1 tablet by mouth daily.    [provider]    Allergies    Patient has no known allergies.  Review of Systems   Review of Systems  Constitutional: Negative for chills and fever.  HENT: Negative for congestion and facial swelling.   Eyes: Negative for discharge and visual disturbance.  Respiratory: Negative for shortness of breath.   Cardiovascular: Negative for chest pain and palpitations.  Gastrointestinal: Negative for abdominal pain, diarrhea and vomiting.  Musculoskeletal: Positive for back pain. Negative for arthralgias and myalgias.  Skin: Negative for color change and rash.  Neurological: Negative for tremors, syncope and headaches.  Psychiatric/Behavioral: Negative for confusion and dysphoric mood.    Physical Exam Updated Vital Signs BP (!) 142/96   Pulse (!) 105   Temp 98.1 F (36.7 C) (Oral)   Resp 17   Ht 5\' 6"  (1.676 m)   Wt 98.9 kg   SpO2 97%   BMI 35.19 kg/m   Physical Exam Vitals and nursing note reviewed.  Constitutional:      Appearance: He is well-developed.  HENT:     Head: Normocephalic and atraumatic.  Eyes:     Pupils: Pupils are equal, round, and reactive to light.  Neck:     Vascular: No JVD.  Cardiovascular:     Rate and Rhythm: Normal rate and regular rhythm.     Heart sounds: No murmur heard. No friction rub. No gallop.   Pulmonary:     Effort: No respiratory distress.     Breath sounds: No wheezing.  Abdominal:      General: There is no distension.     Tenderness: There is no guarding or rebound.  Musculoskeletal:        General: Normal range of motion.     Cervical back: Normal range of motion and neck supple.     Comments: Pulse motor and sensation intact in bilateral lower extremities.  Reflexes are 2+ and equal.  No clonus.  Skin:    Coloration: Skin is not pale.     Findings: No rash.  Neurological:     Mental Status: He is alert and  oriented to person, place, and time.  Psychiatric:        Behavior: Behavior normal.     ED Results / Procedures / Treatments   Labs (all labs ordered are listed, but only abnormal results are displayed) Labs Reviewed - No data to display  EKG None  Radiology No results found.  Procedures Procedures   Medications Ordered in ED Medications  ketorolac (TORADOL) injection 15 mg (15 mg Intramuscular Given 08/13/20 9326)    ED Course  I have reviewed the triage vital signs and the nursing notes.  Pertinent labs & imaging results that were available during my care of the patient were reviewed by me and considered in my medical decision making (see chart for details).    MDM Rules/Calculators/A&P                          65 yo M with chronic low back pain wanting referral to a spinal surgeon.  He denies any acute complaints.  He was given information to follow-up with Dr. Ronnald Ramp in the office.  4:31 AM:  I have discussed the diagnosis/risks/treatment options with the patient and believe the pt to be eligible for discharge home to follow-up with Neurosurgery. We also discussed returning to the ED immediately if new or worsening sx occur. We discussed the sx which are most concerning (e.g., sudden worsening pain, fever, inability to tolerate by mouth, cauda equina sx/s) that necessitate immediate return. Medications administered to the patient during their visit and any new prescriptions provided to the patient are listed below.  Medications given during  this visit Medications  ketorolac (TORADOL) injection 15 mg (15 mg Intramuscular Given 08/13/20 7124)     The patient appears reasonably screen and/or stabilized for discharge and I doubt any other medical condition or other [facility] requiring further screening, evaluation, or treatment in the ED at this time prior to discharge.   Final Clinical Impression(s) / ED Diagnoses Final diagnoses:  Sciatica of right side    Rx / DC Orders ED Discharge Orders    None       Deno Etienne, DO 08/13/20 [phone]

## 2020-08-19 ENCOUNTER — Other Ambulatory Visit: Payer: Self-pay | Admitting: Neurosurgery

## 2020-08-19 DIAGNOSIS — Z6836 Body mass index (BMI) 36.0-36.9, adult: Secondary | ICD-10-CM | POA: Diagnosis not present

## 2020-08-19 DIAGNOSIS — M7138 Other bursal cyst, other site: Secondary | ICD-10-CM | POA: Diagnosis not present

## 2020-08-25 DIAGNOSIS — D124 Benign neoplasm of descending colon: Secondary | ICD-10-CM | POA: Diagnosis not present

## 2020-08-25 DIAGNOSIS — K635 Polyp of colon: Secondary | ICD-10-CM | POA: Diagnosis not present

## 2020-08-25 DIAGNOSIS — Z79899 Other long term (current) drug therapy: Secondary | ICD-10-CM | POA: Diagnosis not present

## 2020-08-25 DIAGNOSIS — J449 Chronic obstructive pulmonary disease, unspecified: Secondary | ICD-10-CM | POA: Diagnosis not present

## 2020-08-25 DIAGNOSIS — D125 Benign neoplasm of sigmoid colon: Secondary | ICD-10-CM | POA: Diagnosis not present

## 2020-08-25 DIAGNOSIS — Z1211 Encounter for screening for malignant neoplasm of colon: Secondary | ICD-10-CM | POA: Diagnosis not present

## 2020-08-25 DIAGNOSIS — K5730 Diverticulosis of large intestine without perforation or abscess without bleeding: Secondary | ICD-10-CM | POA: Diagnosis not present

## 2020-08-25 DIAGNOSIS — I10 Essential (primary) hypertension: Secondary | ICD-10-CM | POA: Diagnosis not present

## 2020-08-25 DIAGNOSIS — Z86010 Personal history of colonic polyps: Secondary | ICD-10-CM | POA: Diagnosis not present

## 2020-08-25 DIAGNOSIS — D126 Benign neoplasm of colon, unspecified: Secondary | ICD-10-CM | POA: Diagnosis not present

## 2020-08-25 DIAGNOSIS — D122 Benign neoplasm of ascending colon: Secondary | ICD-10-CM | POA: Diagnosis not present

## 2020-08-25 DIAGNOSIS — Z7982 Long term (current) use of aspirin: Secondary | ICD-10-CM | POA: Diagnosis not present

## 2020-08-25 DIAGNOSIS — D129 Benign neoplasm of anus and anal canal: Secondary | ICD-10-CM | POA: Diagnosis not present

## 2020-08-25 DIAGNOSIS — Z8673 Personal history of transient ischemic attack (TIA), and cerebral infarction without residual deficits: Secondary | ICD-10-CM | POA: Diagnosis not present

## 2020-08-30 ENCOUNTER — Other Ambulatory Visit: Payer: Self-pay | Admitting: Neurosurgery

## 2020-08-31 NOTE — Progress Notes (Signed)
Surgical Instructions    Your procedure is scheduled on 09/05/20.  Report to [facility] Main Entrance "A" at 12:45 P.M., then check in with the Admitting office.  Call this number if you have problems the morning of surgery:  [phone]   If you have any questions prior to your surgery date call [phone]: Open Monday-Friday 8am-4pm    Remember:  Do not eat or drink after midnight the night before your surgery      Take these medicines the morning of surgery with A SIP OF WATER  albuterol (VENTOLIN HFA) inhaler if needed (bring with you the day of surgery) clonazePAM (KLONOPIN) if needed gabapentin (NEURONTIN)  ipratropium-albuterol (DUONEB) nebulizer if needed NIFEdipine (PROCARDIA XL/NIFEDICAL-XL) simvastatin (ZOCOR)  tiZANidine (ZANAFLEX) if needed venlafaxine XR (EFFEXOR-XR)  As of today, STOP taking (unless otherwise instructed by your surgeon) Aleve, Naproxen, Ibuprofen, Motrin, Advil, Goody's, BC's, all herbal medications, fish oil, and all vitamins. Please follow on surgeons instructions on when to stop aspirin. If no instructions were given to you, please call the surgeons office.           Do not wear jewelry or makeup Do not wear lotions, powders, colognes, or deodorant. Men may shave face and neck. Do not bring valuables to the hospital. DO Not wear nail polish, gel polish, artificial nails, or any other type of covering on natural nails including finger and toenails. If patients have artificial nails, gel coating, etc. that need to be removed by a nail salon please have this removed prior to surgery or surgery may need to be canceled/delayed if the surgeon/ anesthesia feels like the patient is unable to be adequately monitored.             [facility] is not responsible for any belongings or valuables.  Do NOT Smoke (Tobacco/Vaping) or drink Alcohol 24 hours prior to your procedure If you use a CPAP at night, you may bring all equipment for your overnight stay.    Contacts, glasses, dentures or bridgework may not be worn into surgery, please bring cases for these belongings   For patients admitted to the hospital, discharge time will be determined by your treatment team.   Patients discharged the day of surgery will not be allowed to drive home, and someone needs to stay with them for 24 hours.    Special instructions:    Oral Hygiene is also important to reduce your risk of infection.  Remember - BRUSH YOUR TEETH THE MORNING OF SURGERY WITH YOUR REGULAR TOOTHPASTE   [facility]- Preparing For Surgery  Before surgery, you can play an important role. Because skin is not sterile, your skin needs to be as free of germs as possible. You can reduce the number of germs on your skin by washing with CHG (chlorahexidine gluconate) Soap before surgery.  CHG is an antiseptic cleaner which kills germs and bonds with the skin to continue killing germs even after washing.     Please do not use if you have an allergy to CHG or antibacterial soaps. If your skin becomes reddened/irritated stop using the CHG.  Do not shave (including legs and underarms) for at least 48 hours prior to first CHG shower. It is OK to shave your face.  Please follow these instructions carefully.    1.  Shower the NIGHT BEFORE SURGERY and the MORNING OF SURGERY with CHG Soap.   If you chose to wash your hair, wash your hair first as usual with your normal shampoo.  After you shampoo, rinse your hair and body thoroughly to remove the shampoo.  Then [employer] and genitals (private parts) with your normal soap and rinse thoroughly to remove soap.  2. After that Use CHG Soap as you would any other liquid soap. You can apply CHG directly to the skin and wash gently with a scrungie or a clean washcloth.   3. Apply the CHG Soap to your body ONLY FROM THE NECK DOWN.  Do not use on open wounds or open sores. Avoid contact with your eyes, ears, mouth and genitals (private parts). Wash Face and  genitals (private parts)  with your normal soap.   4. Wash thoroughly, paying special attention to the area where your surgery will be performed.  5. Thoroughly rinse your body with warm water from the neck down.  6. DO NOT shower/wash with your normal soap after using and rinsing off the CHG Soap.  7. Pat yourself dry with a CLEAN TOWEL.  8. Wear CLEAN PAJAMAS to bed the night before surgery  9. Place CLEAN SHEETS on your bed the night before your surgery  10. DO NOT SLEEP WITH PETS.   Day of Surgery: Take a shower with CHG soap. Wear Clean/Comfortable clothing the morning of surgery Do not apply any deodorants/lotions.   Remember to brush your teeth WITH YOUR REGULAR TOOTHPASTE.   Please read over the following fact sheets that you were given.

## 2020-09-01 ENCOUNTER — Other Ambulatory Visit (HOSPITAL_COMMUNITY): Payer: Medicare HMO

## 2020-09-01 ENCOUNTER — Encounter (HOSPITAL_COMMUNITY)
Admission: RE | Admit: 2020-09-01 | Discharge: 2020-09-01 | Disposition: A | Discharge status: Home / Self Care | Payer: Medicare HMO | Source: Ambulatory Visit | Attending: Neurosurgery | Admitting: Neurosurgery

## 2020-09-01 ENCOUNTER — Encounter (HOSPITAL_COMMUNITY): Payer: Self-pay

## 2020-09-01 ENCOUNTER — Other Ambulatory Visit: Payer: Self-pay

## 2020-09-01 DIAGNOSIS — Z01818 Encounter for other preprocedural examination: Secondary | ICD-10-CM | POA: Diagnosis not present

## 2020-09-01 DIAGNOSIS — Z20822 Contact with and (suspected) exposure to covid-19: Secondary | ICD-10-CM | POA: Insufficient documentation

## 2020-09-01 HISTORY — DX: Anxiety disorder, unspecified: F41.9

## 2020-09-01 HISTORY — DX: Pneumonia, unspecified organism: J18.9

## 2020-09-01 HISTORY — DX: Depression, unspecified: F32.A

## 2020-09-01 HISTORY — DX: Malignant (primary) neoplasm, unspecified: C80.1

## 2020-09-01 LAB — SURGICAL PCR SCREEN
MRSA, PCR: NEGATIVE
Staphylococcus aureus: NEGATIVE

## 2020-09-01 LAB — BASIC METABOLIC PANEL
Anion gap: 6 (ref 5–15)
BUN: 22 mg/dL (ref 8–23)
CO2: 32 mmol/L (ref 22–32)
Calcium: 9.3 mg/dL (ref 8.9–10.3)
Chloride: 100 mmol/L (ref 98–111)
Creatinine, Ser: 1.36 mg/dL — ABNORMAL HIGH (ref 0.61–1.24)
GFR, Estimated: 57 mL/min — ABNORMAL LOW (ref >60)
Glucose, Bld: 110 mg/dL — ABNORMAL HIGH (ref 70–99)
Potassium: 4.2 mmol/L (ref 3.5–5.1)
Sodium: 138 mmol/L (ref 135–145)

## 2020-09-01 LAB — CBC
HCT: 38.8 % — ABNORMAL LOW (ref 39.0–52.0)
Hemoglobin: 11.7 g/dL — ABNORMAL LOW (ref 13.0–17.0)
MCH: 25.7 pg — ABNORMAL LOW (ref 26.0–34.0)
MCHC: 30.2 g/dL (ref 30.0–36.0)
MCV: 85.3 fL (ref 80.0–100.0)
Platelets: 189 10*3/uL (ref 150–400)
RBC: 4.55 MIL/uL (ref 4.22–5.81)
RDW: 14.8 % (ref 11.5–15.5)
WBC: 6 10*3/uL (ref 4.0–10.5)
nRBC: 0 % (ref 0.0–0.2)

## 2020-09-01 NOTE — Progress Notes (Addendum)
PCP - Monico Blitz MD, [facility] Cardiologist - denies  PPM/ICD -  Device Orders -  Rep Notified -   Chest x-ray - n/a EKG - 09/01/2020 Stress Test - denies ECHO - denies Cardiac Cath denies  Sleep Study - 08/26/02 CPAP -no   Fasting Blood Sugar - n/a Checks Blood Sugar _n/a____ times a day  Blood Thinner Instructions:n/a Aspirin Instructions: instructed pt to call surgeon for instructions in regards to taking medicine.   ERAS Protcol -n/a PRE-SURGERY Ensure or G2-   COVID TEST- 09/01/2020   Anesthesia review: Yes  Patient denies shortness of breath, fever, cough and chest pain at PAT appointment   All instructions explained to the patient, with a verbal understanding of the material. Patient agrees to go over the instructions while at home for a better understanding. Patient also instructed to self quarantine after being tested for COVID-19. The opportunity to ask questions was provided.

## 2020-09-02 LAB — SARS CORONAVIRUS 2 (TAT 6-24 HRS): SARS Coronavirus 2: NEGATIVE

## 2020-09-05 ENCOUNTER — Ambulatory Visit (HOSPITAL_COMMUNITY): Payer: Medicare HMO | Admitting: Anesthesiology

## 2020-09-05 ENCOUNTER — Ambulatory Visit (HOSPITAL_COMMUNITY): Payer: Medicare HMO

## 2020-09-05 ENCOUNTER — Ambulatory Visit (HOSPITAL_COMMUNITY)
Admission: RE | Admit: 2020-09-05 | Discharge: 2020-09-06 | Disposition: A | Discharge status: Home / Self Care | Payer: Medicare HMO | Attending: Neurosurgery | Admitting: Neurosurgery

## 2020-09-05 ENCOUNTER — Encounter (HOSPITAL_COMMUNITY)
Admission: RE | Disposition: A | Discharge status: Home / Self Care | Payer: Self-pay | Source: Home / Self Care | Attending: Neurosurgery

## 2020-09-05 ENCOUNTER — Encounter (HOSPITAL_COMMUNITY): Payer: Self-pay | Admitting: Neurosurgery

## 2020-09-05 ENCOUNTER — Ambulatory Visit (HOSPITAL_COMMUNITY): Payer: Medicare HMO | Admitting: Emergency Medicine

## 2020-09-05 DIAGNOSIS — Z87891 Personal history of nicotine dependence: Secondary | ICD-10-CM | POA: Diagnosis not present

## 2020-09-05 DIAGNOSIS — Z85828 Personal history of other malignant neoplasm of skin: Secondary | ICD-10-CM | POA: Diagnosis not present

## 2020-09-05 DIAGNOSIS — Z7982 Long term (current) use of aspirin: Secondary | ICD-10-CM | POA: Insufficient documentation

## 2020-09-05 DIAGNOSIS — Z79899 Other long term (current) drug therapy: Secondary | ICD-10-CM | POA: Insufficient documentation

## 2020-09-05 DIAGNOSIS — Z419 Encounter for procedure for purposes other than remedying health state, unspecified: Secondary | ICD-10-CM

## 2020-09-05 DIAGNOSIS — Z981 Arthrodesis status: Secondary | ICD-10-CM | POA: Diagnosis not present

## 2020-09-05 DIAGNOSIS — M7138 Other bursal cyst, other site: Secondary | ICD-10-CM | POA: Diagnosis present

## 2020-09-05 DIAGNOSIS — M5417 Radiculopathy, lumbosacral region: Secondary | ICD-10-CM | POA: Diagnosis not present

## 2020-09-05 DIAGNOSIS — I10 Essential (primary) hypertension: Secondary | ICD-10-CM | POA: Diagnosis not present

## 2020-09-05 DIAGNOSIS — Z8673 Personal history of transient ischemic attack (TIA), and cerebral infarction without residual deficits: Secondary | ICD-10-CM | POA: Diagnosis not present

## 2020-09-05 DIAGNOSIS — J449 Chronic obstructive pulmonary disease, unspecified: Secondary | ICD-10-CM | POA: Diagnosis not present

## 2020-09-05 DIAGNOSIS — Z7951 Long term (current) use of inhaled steroids: Secondary | ICD-10-CM | POA: Insufficient documentation

## 2020-09-05 HISTORY — PX: LUMBAR LAMINECTOMY/DECOMPRESSION MICRODISCECTOMY: SHX5026

## 2020-09-05 SURGERY — LUMBAR LAMINECTOMY/DECOMPRESSION MICRODISCECTOMY 1 LEVEL
Anesthesia: General | Site: Spine Lumbar

## 2020-09-05 MED ORDER — LIDOCAINE 2% (20 MG/ML) 5 ML SYRINGE
INTRAMUSCULAR | Status: DC | PRN
  Administered 2020-09-05: 60 mg via INTRAVENOUS

## 2020-09-05 MED ORDER — CLONAZEPAM 0.5 MG PO TABS: 0.5000 mg | ORAL_TABLET | Freq: Three times a day (TID) | ORAL | Status: DC | PRN

## 2020-09-05 MED ORDER — MIDAZOLAM HCL 2 MG/2ML IJ SOLN
INTRAMUSCULAR | Status: AC
  Filled 2020-09-05: qty 2

## 2020-09-05 MED ORDER — CEFAZOLIN SODIUM-DEXTROSE 2-4 GM/100ML-% IV SOLN
2.0000 g | Freq: Three times a day (TID) | INTRAVENOUS | Status: AC
  Administered 2020-09-05 – 2020-09-06 (×2): 2 g via INTRAVENOUS
  Filled 2020-09-05 (×2): qty 100

## 2020-09-05 MED ORDER — CHLORHEXIDINE GLUCONATE 0.12 % MT SOLN: 15.0000 mL | Freq: Once | OROMUCOSAL | Status: AC

## 2020-09-05 MED ORDER — HEMOSTATIC AGENTS (NO CHARGE) OPTIME
TOPICAL | Status: DC | PRN
  Administered 2020-09-05: 1 via TOPICAL

## 2020-09-05 MED ORDER — THROMBIN 5000 UNITS EX SOLR
CUTANEOUS | Status: AC
  Filled 2020-09-05: qty 10000

## 2020-09-05 MED ORDER — NIFEDIPINE ER OSMOTIC RELEASE 90 MG PO TB24
90.0000 mg | ORAL_TABLET | Freq: Every day | ORAL | Status: DC
  Administered 2020-09-06: 90 mg via ORAL
  Filled 2020-09-05: qty 1

## 2020-09-05 MED ORDER — TERAZOSIN HCL 5 MG PO CAPS
5.0000 mg | ORAL_CAPSULE | Freq: Every day | ORAL | Status: DC
  Administered 2020-09-05: 5 mg via ORAL
  Filled 2020-09-05 (×2): qty 1

## 2020-09-05 MED ORDER — SODIUM CHLORIDE 0.9% FLUSH: 3.0000 mL | INTRAVENOUS | Status: DC | PRN

## 2020-09-05 MED ORDER — LIDOCAINE 2% (20 MG/ML) 5 ML SYRINGE
INTRAMUSCULAR | Status: AC
  Filled 2020-09-05: qty 5

## 2020-09-05 MED ORDER — ACETAMINOPHEN 500 MG PO TABS
1000.0000 mg | ORAL_TABLET | Freq: Four times a day (QID) | ORAL | Status: DC
  Administered 2020-09-05 – 2020-09-06 (×3): 1000 mg via ORAL
  Filled 2020-09-05 (×3): qty 2

## 2020-09-05 MED ORDER — LACTATED RINGERS IV SOLN: INTRAVENOUS | Status: DC

## 2020-09-05 MED ORDER — PHENYLEPHRINE 40 MCG/ML (10ML) SYRINGE FOR IV PUSH (FOR BLOOD PRESSURE SUPPORT)
PREFILLED_SYRINGE | INTRAVENOUS | Status: AC
  Filled 2020-09-05: qty 10

## 2020-09-05 MED ORDER — MIDAZOLAM HCL 2 MG/2ML IJ SOLN
INTRAMUSCULAR | Status: DC | PRN
  Administered 2020-09-05: 2 mg via INTRAVENOUS

## 2020-09-05 MED ORDER — PHENOL 1.4 % MT LIQD
1.0000 | OROMUCOSAL | Status: DC | PRN
  Filled 2020-09-05: qty 177

## 2020-09-05 MED ORDER — SUFENTANIL CITRATE 50 MCG/ML IV SOLN
INTRAVENOUS | Status: DC | PRN
  Administered 2020-09-05: 20 ug via INTRAVENOUS

## 2020-09-05 MED ORDER — VENLAFAXINE HCL ER 75 MG PO CP24
150.0000 mg | ORAL_CAPSULE | Freq: Every day | ORAL | Status: DC
  Administered 2020-09-06: 150 mg via ORAL
  Filled 2020-09-05: qty 2

## 2020-09-05 MED ORDER — THROMBIN 5000 UNITS EX SOLR
CUTANEOUS | Status: DC | PRN
  Administered 2020-09-05 (×2): 5000 [IU] via TOPICAL

## 2020-09-05 MED ORDER — SIMVASTATIN 20 MG PO TABS
20.0000 mg | ORAL_TABLET | Freq: Every day | ORAL | Status: DC
  Administered 2020-09-05 – 2020-09-06 (×2): 20 mg via ORAL
  Filled 2020-09-05 (×2): qty 1

## 2020-09-05 MED ORDER — HYDROMORPHONE HCL 1 MG/ML IJ SOLN
INTRAMUSCULAR | Status: AC
  Filled 2020-09-05: qty 1

## 2020-09-05 MED ORDER — TIZANIDINE HCL 4 MG PO TABS: 4.0000 mg | ORAL_TABLET | Freq: Four times a day (QID) | ORAL | Status: DC | PRN

## 2020-09-05 MED ORDER — PHENYLEPHRINE HCL-NACL 10-0.9 MG/250ML-% IV SOLN
INTRAVENOUS | Status: DC | PRN
  Administered 2020-09-05: 100 ug/min via INTRAVENOUS

## 2020-09-05 MED ORDER — ROCURONIUM BROMIDE 10 MG/ML (PF) SYRINGE
PREFILLED_SYRINGE | INTRAVENOUS | Status: AC
  Filled 2020-09-05: qty 10

## 2020-09-05 MED ORDER — BISACODYL 10 MG RE SUPP: 10.0000 mg | Freq: Every day | RECTAL | Status: DC | PRN

## 2020-09-05 MED ORDER — EPHEDRINE SULFATE-NACL 50-0.9 MG/10ML-% IV SOSY
PREFILLED_SYRINGE | INTRAVENOUS | Status: DC | PRN
  Administered 2020-09-05: 10 mg via INTRAVENOUS

## 2020-09-05 MED ORDER — DOCUSATE SODIUM 100 MG PO CAPS
100.0000 mg | ORAL_CAPSULE | Freq: Two times a day (BID) | ORAL | Status: DC
  Administered 2020-09-05 – 2020-09-06 (×2): 100 mg via ORAL
  Filled 2020-09-05 (×2): qty 1

## 2020-09-05 MED ORDER — HYDROMORPHONE HCL 1 MG/ML IJ SOLN
0.2500 mg | INTRAMUSCULAR | Status: DC | PRN
  Administered 2020-09-05: 0.25 mg via INTRAVENOUS

## 2020-09-05 MED ORDER — PROPOFOL 10 MG/ML IV BOLUS
INTRAVENOUS | Status: DC | PRN
  Administered 2020-09-05: 130 mg via INTRAVENOUS

## 2020-09-05 MED ORDER — IRBESARTAN 150 MG PO TABS
300.0000 mg | ORAL_TABLET | Freq: Every day | ORAL | Status: DC
  Administered 2020-09-06: 300 mg via ORAL
  Filled 2020-09-05: qty 2

## 2020-09-05 MED ORDER — SODIUM CHLORIDE 0.9% FLUSH: 3.0000 mL | Freq: Two times a day (BID) | INTRAVENOUS | Status: DC

## 2020-09-05 MED ORDER — THROMBIN 5000 UNITS EX SOLR
CUTANEOUS | Status: AC
  Filled 2020-09-05: qty 5000

## 2020-09-05 MED ORDER — TRAZODONE HCL 150 MG PO TABS
150.0000 mg | ORAL_TABLET | Freq: Every day | ORAL | Status: DC
  Administered 2020-09-05: 150 mg via ORAL
  Filled 2020-09-05: qty 1

## 2020-09-05 MED ORDER — FENTANYL CITRATE (PF) 100 MCG/2ML IJ SOLN
INTRAMUSCULAR | Status: AC
  Filled 2020-09-05: qty 2

## 2020-09-05 MED ORDER — ROCURONIUM BROMIDE 10 MG/ML (PF) SYRINGE
PREFILLED_SYRINGE | INTRAVENOUS | Status: DC | PRN
  Administered 2020-09-05: 60 mg via INTRAVENOUS
  Administered 2020-09-05: 10 mg via INTRAVENOUS

## 2020-09-05 MED ORDER — MORPHINE SULFATE (PF) 4 MG/ML IV SOLN: 4.0000 mg | INTRAVENOUS | Status: DC | PRN

## 2020-09-05 MED ORDER — ALBUTEROL SULFATE (2.5 MG/3ML) 0.083% IN NEBU
3.0000 mL | INHALATION_SOLUTION | Freq: Four times a day (QID) | RESPIRATORY_TRACT | Status: DC | PRN
  Administered 2020-09-06: 3 mL via RESPIRATORY_TRACT
  Filled 2020-09-05: qty 3

## 2020-09-05 MED ORDER — CEFAZOLIN SODIUM-DEXTROSE 2-4 GM/100ML-% IV SOLN
2.0000 g | INTRAVENOUS | Status: AC
  Administered 2020-09-05: 2 g via INTRAVENOUS

## 2020-09-05 MED ORDER — 0.9 % SODIUM CHLORIDE (POUR BTL) OPTIME
TOPICAL | Status: DC | PRN
  Administered 2020-09-05: 1000 mL

## 2020-09-05 MED ORDER — CHLORHEXIDINE GLUCONATE CLOTH 2 % EX PADS: 6.0000 | MEDICATED_PAD | Freq: Once | CUTANEOUS | Status: DC

## 2020-09-05 MED ORDER — ACETAMINOPHEN 500 MG PO TABS
ORAL_TABLET | ORAL | Status: AC
  Administered 2020-09-05: 1000 mg via ORAL
  Filled 2020-09-05: qty 2

## 2020-09-05 MED ORDER — SUFENTANIL CITRATE 50 MCG/ML IV SOLN
INTRAVENOUS | Status: AC
  Filled 2020-09-05: qty 1

## 2020-09-05 MED ORDER — BUPIVACAINE-EPINEPHRINE 0.5% -1:200000 IJ SOLN
INTRAMUSCULAR | Status: AC
  Filled 2020-09-05: qty 1

## 2020-09-05 MED ORDER — ACETAMINOPHEN 650 MG RE SUPP: 650.0000 mg | RECTAL | Status: DC | PRN

## 2020-09-05 MED ORDER — SODIUM CHLORIDE (PF) 0.9 % IJ SOLN
INTRAMUSCULAR | Status: AC
  Filled 2020-09-05: qty 10

## 2020-09-05 MED ORDER — ONDANSETRON HCL 4 MG/2ML IJ SOLN: 4.0000 mg | Freq: Four times a day (QID) | INTRAMUSCULAR | Status: DC | PRN

## 2020-09-05 MED ORDER — ONDANSETRON HCL 4 MG/2ML IJ SOLN
INTRAMUSCULAR | Status: DC | PRN
  Administered 2020-09-05: 4 mg via INTRAVENOUS

## 2020-09-05 MED ORDER — EPHEDRINE 5 MG/ML INJ
INTRAVENOUS | Status: AC
  Filled 2020-09-05: qty 10

## 2020-09-05 MED ORDER — FENTANYL CITRATE (PF) 100 MCG/2ML IJ SOLN
50.0000 ug | Freq: Once | INTRAMUSCULAR | Status: AC
  Administered 2020-09-05: 50 ug via INTRAVENOUS

## 2020-09-05 MED ORDER — PHENYLEPHRINE 40 MCG/ML (10ML) SYRINGE FOR IV PUSH (FOR BLOOD PRESSURE SUPPORT)
PREFILLED_SYRINGE | INTRAVENOUS | Status: DC | PRN
  Administered 2020-09-05: 80 ug via INTRAVENOUS
  Administered 2020-09-05: 120 ug via INTRAVENOUS
  Administered 2020-09-05: 80 ug via INTRAVENOUS
  Administered 2020-09-05: 120 ug via INTRAVENOUS

## 2020-09-05 MED ORDER — THROMBIN 5000 UNITS EX SOLR
OROMUCOSAL | Status: DC | PRN
  Administered 2020-09-05: 5 mL via TOPICAL

## 2020-09-05 MED ORDER — SODIUM CHLORIDE 0.9 % IV SOLN: 250.0000 mL | INTRAVENOUS | Status: DC

## 2020-09-05 MED ORDER — CHLORHEXIDINE GLUCONATE 0.12 % MT SOLN
OROMUCOSAL | Status: AC
  Administered 2020-09-05: 15 mL via OROMUCOSAL
  Filled 2020-09-05: qty 15

## 2020-09-05 MED ORDER — DEXAMETHASONE SODIUM PHOSPHATE 10 MG/ML IJ SOLN
INTRAMUSCULAR | Status: DC | PRN
  Administered 2020-09-05: 5 mg via INTRAVENOUS

## 2020-09-05 MED ORDER — ACETAMINOPHEN 500 MG PO TABS: 1000.0000 mg | ORAL_TABLET | ORAL | Status: AC

## 2020-09-05 MED ORDER — CYCLOBENZAPRINE HCL 10 MG PO TABS: 10.0000 mg | ORAL_TABLET | Freq: Three times a day (TID) | ORAL | Status: DC | PRN

## 2020-09-05 MED ORDER — ORAL CARE MOUTH RINSE: 15.0000 mL | Freq: Once | OROMUCOSAL | Status: AC

## 2020-09-05 MED ORDER — ACETAMINOPHEN 325 MG PO TABS: 650.0000 mg | ORAL_TABLET | ORAL | Status: DC | PRN

## 2020-09-05 MED ORDER — BACITRACIN ZINC 500 UNIT/GM EX OINT
TOPICAL_OINTMENT | CUTANEOUS | Status: AC
  Filled 2020-09-05: qty 28.35

## 2020-09-05 MED ORDER — GABAPENTIN 300 MG PO CAPS
300.0000 mg | ORAL_CAPSULE | Freq: Three times a day (TID) | ORAL | Status: DC
  Administered 2020-09-05 – 2020-09-06 (×2): 300 mg via ORAL
  Filled 2020-09-05 (×2): qty 1

## 2020-09-05 MED ORDER — OXYCODONE HCL 5 MG PO TABS
10.0000 mg | ORAL_TABLET | ORAL | Status: DC | PRN
  Administered 2020-09-05 – 2020-09-06 (×2): 10 mg via ORAL
  Filled 2020-09-05 (×2): qty 2

## 2020-09-05 MED ORDER — IPRATROPIUM-ALBUTEROL 0.5-2.5 (3) MG/3ML IN SOLN: 3.0000 mL | RESPIRATORY_TRACT | Status: DC | PRN

## 2020-09-05 MED ORDER — POTASSIUM CHLORIDE CRYS ER 20 MEQ PO TBCR
20.0000 meq | EXTENDED_RELEASE_TABLET | Freq: Every day | ORAL | Status: DC
  Administered 2020-09-06: 20 meq via ORAL
  Filled 2020-09-05: qty 1

## 2020-09-05 MED ORDER — CEFAZOLIN SODIUM-DEXTROSE 2-4 GM/100ML-% IV SOLN
INTRAVENOUS | Status: AC
  Filled 2020-09-05: qty 100

## 2020-09-05 MED ORDER — BUPIVACAINE-EPINEPHRINE (PF) 0.5% -1:200000 IJ SOLN
INTRAMUSCULAR | Status: DC | PRN
  Administered 2020-09-05: 20 mL

## 2020-09-05 MED ORDER — PROPOFOL 10 MG/ML IV BOLUS
INTRAVENOUS | Status: AC
  Filled 2020-09-05: qty 20

## 2020-09-05 MED ORDER — OXYCODONE HCL 5 MG PO TABS
5.0000 mg | ORAL_TABLET | ORAL | Status: DC | PRN
  Administered 2020-09-05: 5 mg via ORAL
  Filled 2020-09-05: qty 1

## 2020-09-05 MED ORDER — ONDANSETRON HCL 4 MG PO TABS: 4.0000 mg | ORAL_TABLET | Freq: Four times a day (QID) | ORAL | Status: DC | PRN

## 2020-09-05 MED ORDER — MENTHOL 3 MG MT LOZG: 1.0000 | LOZENGE | OROMUCOSAL | Status: DC | PRN

## 2020-09-05 MED ORDER — HYDROCHLOROTHIAZIDE 25 MG PO TABS
25.0000 mg | ORAL_TABLET | Freq: Every day | ORAL | Status: DC
  Administered 2020-09-06: 25 mg via ORAL
  Filled 2020-09-05: qty 1

## 2020-09-05 MED ORDER — BACITRACIN ZINC 500 UNIT/GM EX OINT
TOPICAL_OINTMENT | CUTANEOUS | Status: DC | PRN
  Administered 2020-09-05: 1 via TOPICAL

## 2020-09-05 SURGICAL SUPPLY — 45 items
BAND RUBBER #18 3X1/16 STRL (MISCELLANEOUS) ×4 IMPLANT
BENZOIN TINCTURE PRP APPL 2/3 (GAUZE/BANDAGES/DRESSINGS) ×2 IMPLANT
BLADE CLIPPER SURG (BLADE) ×2 IMPLANT
BUR MATCHSTICK NEURO 3.0 LAGG (BURR) ×2 IMPLANT
BUR PRECISION FLUTE 6.0 (BURR) ×2 IMPLANT
CANISTER SUCT 3000ML PPV (MISCELLANEOUS) ×2 IMPLANT
CARTRIDGE OIL MAESTRO DRILL (MISCELLANEOUS) ×1 IMPLANT
CLSR STERI-STRIP ANTIMIC 1/2X4 (GAUZE/BANDAGES/DRESSINGS) ×2 IMPLANT
COVER WAND RF STERILE (DRAPES) IMPLANT
DIFFUSER DRILL AIR PNEUMATIC (MISCELLANEOUS) ×2 IMPLANT
DRAPE LAPAROTOMY 100X72X124 (DRAPES) ×2 IMPLANT
DRAPE MICROSCOPE LEICA (MISCELLANEOUS) ×2 IMPLANT
DRAPE SURG 17X23 STRL (DRAPES) ×8 IMPLANT
DRSG OPSITE POSTOP 4X6 (GAUZE/BANDAGES/DRESSINGS) ×2 IMPLANT
ELECT BLADE 4.0 EZ CLEAN MEGAD (MISCELLANEOUS) ×2
ELECT REM PT RETURN 9FT ADLT (ELECTROSURGICAL) ×2
ELECTRODE BLDE 4.0 EZ CLN MEGD (MISCELLANEOUS) ×1 IMPLANT
ELECTRODE REM PT RTRN 9FT ADLT (ELECTROSURGICAL) ×1 IMPLANT
GAUZE 4X4 16PLY RFD (DISPOSABLE) IMPLANT
GAUZE SPONGE 4X4 12PLY STRL (GAUZE/BANDAGES/DRESSINGS) ×2 IMPLANT
GLOVE BIO SURGEON STRL SZ8 (GLOVE) ×2 IMPLANT
GLOVE BIO SURGEON STRL SZ8.5 (GLOVE) ×2 IMPLANT
GLOVE EXAM NITRILE XL STR (GLOVE) IMPLANT
GOWN STRL REUS W/ TWL LRG LVL3 (GOWN DISPOSABLE) IMPLANT
GOWN STRL REUS W/ TWL XL LVL3 (GOWN DISPOSABLE) ×1 IMPLANT
GOWN STRL REUS W/TWL 2XL LVL3 (GOWN DISPOSABLE) IMPLANT
GOWN STRL REUS W/TWL LRG LVL3 (GOWN DISPOSABLE)
GOWN STRL REUS W/TWL XL LVL3 (GOWN DISPOSABLE) ×1
KIT BASIN OR (CUSTOM PROCEDURE TRAY) ×2 IMPLANT
KIT TURNOVER KIT B (KITS) ×2 IMPLANT
NEEDLE HYPO 21X1.5 SAFETY (NEEDLE) IMPLANT
NEEDLE HYPO 22GX1.5 SAFETY (NEEDLE) ×2 IMPLANT
NS IRRIG 1000ML POUR BTL (IV SOLUTION) ×2 IMPLANT
OIL CARTRIDGE MAESTRO DRILL (MISCELLANEOUS) ×2
PACK LAMINECTOMY NEURO (CUSTOM PROCEDURE TRAY) ×2 IMPLANT
PAD ARMBOARD 7.5X6 YLW CONV (MISCELLANEOUS) ×8 IMPLANT
PATTIES SURGICAL .5 X1 (DISPOSABLE) IMPLANT
SPONGE SURGIFOAM ABS GEL SZ50 (HEMOSTASIS) ×2 IMPLANT
STRIP CLOSURE SKIN 1/2X4 (GAUZE/BANDAGES/DRESSINGS) ×2 IMPLANT
SUT VIC AB 1 CT1 18XBRD ANBCTR (SUTURE) ×1 IMPLANT
SUT VIC AB 1 CT1 8-18 (SUTURE) ×1
SUT VIC AB 2-0 CP2 18 (SUTURE) ×2 IMPLANT
TOWEL GREEN STERILE (TOWEL DISPOSABLE) ×2 IMPLANT
TOWEL GREEN STERILE FF (TOWEL DISPOSABLE) ×2 IMPLANT
WATER STERILE IRR 1000ML POUR (IV SOLUTION) ×2 IMPLANT

## 2020-09-05 NOTE — H&P (Signed)
Subjective: The patient is a 65 year old black male who has complained of back and right leg pain consistent with a lumbosacral radiculopathy.  He has failed medical management and was worked up with a lumbar MRI which demonstrated a right L5-S1 synovial cyst.  I discussed the various treatment options.  He has decided proceed with surgery.  Past Medical History:  Diagnosis Date  . Anxiety   . Arthritis   . Asthma   . Cancer ([place])    skin cancer on right thumb  . COPD (chronic obstructive pulmonary disease) ([place])   . Depression   . Hyperlipidemia   . Hypertension   . Insomnia   . Pneumonia    1975  . Stroke [facility])     Past Surgical History:  Procedure Laterality Date  . APPENDECTOMY    . EYE SURGERY     cataracts bilateral  . MOHS SURGERY Right 2002   rt hand    No Known Allergies  Social History   Tobacco Use  . Smoking status: Former Smoker    Packs/day: 2.00    Years: 46.00    Pack years: 92.00    Types: Cigarettes    Quit date: 09/17/2015    Years since quitting: 4.9  . Smokeless tobacco: Never Used  . Tobacco comment: vapes 12/11/2019  Substance Use Topics  . Alcohol use: No    Alcohol/week: 0.0 standard drinks    Family History  Problem Relation Age of Onset  . Heart attack Father   . Stroke Brother   . Stroke Brother    Prior to Admission medications   Medication Sig Start Date End Date Taking? Authorizing Provider  albuterol (VENTOLIN HFA) 108 (90 Base) MCG/ACT inhaler Inhale 2 puffs into the lungs every 6 (six) hours as needed for wheezing or shortness of breath.   Yes [provider]  aspirin 81 MG tablet Take 81 mg by mouth daily.   Yes [provider]  clonazePAM (KLONOPIN) 0.5 MG tablet Take 0.5 mg by mouth 3 (three) times daily as needed for anxiety.   Yes [provider]  gabapentin (NEURONTIN) 300 MG capsule Take 300 mg by mouth 3 (three) times daily.   Yes [provider]  hydrochlorothiazide (HYDRODIURIL) 25 MG  tablet Take 25 mg by mouth daily. 06/17/19  Yes [provider]  ipratropium-albuterol (DUONEB) 0.5-2.5 (3) MG/3ML SOLN Take 3 mLs by nebulization every 4 (four) hours as needed (Asthma).   Yes [provider]  NIFEdipine (PROCARDIA XL/NIFEDICAL-XL) 90 MG 24 hr tablet Take 90 mg by mouth daily.   Yes [provider]  Omega-3 1000 MG CAPS Take 1,000 mg by mouth daily.   Yes [provider]  potassium chloride SA (K-DUR,KLOR-CON) 20 MEQ tablet Take 20 mEq by mouth daily.   Yes [provider]  terazosin (HYTRIN) 5 MG capsule Take 5 mg by mouth at bedtime.   Yes [provider]  tiZANidine (ZANAFLEX) 4 MG tablet Take 4 mg by mouth every 6 (six) hours as needed for muscle spasms.   Yes [provider]  traZODone (DESYREL) 150 MG tablet Take 150 mg by mouth at bedtime.   Yes [provider]  valsartan (DIOVAN) 320 MG tablet Take 320 mg by mouth daily. 07/21/20  Yes [provider]  venlafaxine XR (EFFEXOR-XR) 150 MG 24 hr capsule Take 150 mg by mouth daily with breakfast.   Yes [provider]  Fluticasone-Umeclidin-Vilant (TRELEGY ELLIPTA) 100-62.5-25 MCG/INH AEPB Inhale 1 puff into the  lungs daily. Patient not taking: No sig reported 12/11/19   Tanda Rockers, MD  Fluticasone-Umeclidin-Vilant (TRELEGY ELLIPTA) 100-62.5-25 MCG/INH AEPB Inhale 1 puff into the lungs daily. Patient not taking: No sig reported 01/21/20   Tanda Rockers, MD  simvastatin (ZOCOR) 20 MG tablet Take 20 mg by mouth daily.    [provider]     Review of Systems  Positive ROS: As above  All other systems have been reviewed and were otherwise negative with the exception of those mentioned in the HPI and as above.  Objective: Vital signs in last 24 hours: Temp:  [98.6 F (37 C)] 98.6 F (37 C) (06/06 1116) Pulse Rate:  [103] 103 (06/06 1116) Resp:  [18] 18 (06/06 1116) BP: (138)/(71) 138/71 (06/06 1116) SpO2:  [92 %-96 %]  96 % (06/06 1304) FiO2 (%):  [2 %] 2 % (06/06 1304) Weight:  [100.7 kg] 100.7 kg (06/06 1116) Estimated body mass index is 35.83 kg/m as calculated from the following:   Height as of this encounter: 5\' 6"  (1.676 m).   Weight as of this encounter: 100.7 kg.   General Appearance: Alert Head: Normocephalic, without obvious abnormality, atraumatic Eyes: PERRL, conjunctiva/corneas clear, EOM's intact,    Ears: Normal  Throat: Normal  Neck: Supple, Back: unremarkable Lungs: Clear to auscultation bilaterally, respirations unlabored Heart: Regular rate and rhythm, no murmur, rub or gallop Abdomen: Soft, non-tender Extremities: Extremities normal, atraumatic, no cyanosis or edema Skin: unremarkable  NEUROLOGIC:   Mental status: alert and oriented,Motor Exam - grossly normal Sensory Exam - grossly normal Reflexes:  Coordination - grossly normal Gait - grossly normal Balance - grossly normal Cranial Nerves: I: smell Not tested  II: visual acuity  OS: Normal  OD: Normal   II: visual fields Full to confrontation  II: pupils Equal, round, reactive to light  III,VII: ptosis None  III,IV,VI: extraocular muscles  Full ROM  V: mastication Normal  V: facial light touch sensation  Normal  V,VII: corneal reflex  Present  VII: facial muscle function - upper  Normal  VII: facial muscle function - lower Normal  VIII: hearing Not tested  IX: soft palate elevation  Normal  IX,X: gag reflex Present  XI: trapezius strength  5/5  XI: sternocleidomastoid strength 5/5  XI: neck flexion strength  5/5  XII: tongue strength  Normal    Data Review Lab Results  Component Value Date   WBC 6.0 09/01/2020   HGB 11.7 (L) 09/01/2020   HCT 38.8 (L) 09/01/2020   MCV 85.3 09/01/2020   PLT 189 09/01/2020   Lab Results  Component Value Date   NA 138 09/01/2020   K 4.2 09/01/2020   CL 100 09/01/2020   CO2 32 09/01/2020   BUN 22 09/01/2020   CREATININE 1.36 (H) 09/01/2020   GLUCOSE 110 (H)  09/01/2020   No results found for: INR, PROTIME  Assessment/Plan: Right L5-S1 synovial cyst, lumbago, lumbar radiculopathy: I have discussed the situation with the patient.  I have reviewed his MRI scan with him and pointed out the abnormalities.  We have discussed the various treatment options including surgery.  I have described the surgical treatment option of a right L5-S1 hemilaminectomy for resection of a synovial cyst.  I have described the surgery to him.  I have shown him surgical models.  I have given him a surgical pamphlet.  We have discussed the risk, benefits, alternatives, expected postop course, and likelihood of achieving our goals with surgery.  I have  answered all his questions.  He has decided to proceed with surgery.   Ophelia Charter 09/05/2020 2:23 PM

## 2020-09-05 NOTE — Anesthesia Preprocedure Evaluation (Addendum)
Anesthesia Evaluation  Patient identified by MRN, date of birth, ID band Patient awake    Reviewed: Allergy & Precautions, H&P , NPO status , Patient's Chart, lab work & pertinent test results  Airway Mallampati: II  TM Distance: >3 FB Neck ROM: Full    Dental no notable dental hx. (+) Edentulous Upper, Edentulous Lower, Dental Advisory Given   Pulmonary asthma , COPD,  COPD inhaler, Patient abstained from smoking., former smoker,    Pulmonary exam normal breath sounds clear to auscultation       Cardiovascular hypertension, Pt. on medications  Rhythm:Regular Rate:Normal     Neuro/Psych Anxiety Depression CVA, No Residual Symptoms    GI/Hepatic negative GI ROS, Neg liver ROS,   Endo/Other  Morbid obesity  Renal/GU negative Renal ROS  negative genitourinary   Musculoskeletal  (+) Arthritis , Osteoarthritis,    Abdominal   Peds  Hematology negative hematology ROS (+)   Anesthesia Other Findings   Reproductive/Obstetrics negative OB ROS                            Anesthesia Physical Anesthesia Plan  ASA: III  Anesthesia Plan: General   Post-op Pain Management:    Induction: Intravenous  PONV Risk Score and Plan: 3 and Ondansetron, Dexamethasone and Midazolam  Airway Management Planned: Oral ETT  Additional Equipment:   Intra-op Plan:   Post-operative Plan: Extubation in OR  Informed Consent: I have reviewed the patients History and Physical, chart, labs and discussed the procedure including the risks, benefits and alternatives for the proposed anesthesia with the patient or authorized representative who has indicated his/her understanding and acceptance.     Dental advisory given  Plan Discussed with: CRNA  Anesthesia Plan Comments:         Anesthesia Quick Evaluation

## 2020-09-05 NOTE — Transfer of Care (Signed)
Immediate Anesthesia Transfer of Care Note  Patient: Sean Ramos  Procedure(s) Performed: LAMINECTOMY LUMBAR FIVE  - SACRAL ONE (N/A Spine Lumbar)  Patient Location: PACU  Anesthesia Type:General  Level of Consciousness: drowsy and patient cooperative  Airway & Oxygen Therapy: Patient Spontanous Breathing and Patient connected to nasal cannula oxygen  Post-op Assessment: Report given to RN, Post -op Vital signs reviewed and stable and Patient moving all extremities X 4  Post vital signs: Reviewed and stable  Last Vitals:  Vitals Value Taken Time  BP 134/82 09/05/20 1717  Temp    Pulse 88 09/05/20 1718  Resp 13 09/05/20 1718  SpO2 92 % 09/05/20 1718  Vitals shown include unvalidated device data.  Last Pain:  Vitals:   09/05/20 1402  TempSrc:   PainSc: Asleep      Patients Stated Pain Goal: 1 (06/81/66 1970)  Complications: No complications documented.

## 2020-09-05 NOTE — Anesthesia Procedure Notes (Signed)
Procedure Name: Intubation Date/Time: 09/05/2020 3:08 PM Performed by: Moshe Salisbury, CRNA Pre-anesthesia Checklist: Patient identified, Emergency Drugs available, Suction available and Patient being monitored Patient Re-evaluated:Patient Re-evaluated prior to induction Oxygen Delivery Method: Circle System Utilized Preoxygenation: Pre-oxygenation with 100% oxygen Induction Type: IV induction Ventilation: Mask ventilation without difficulty Laryngoscope Size: Mac and 4 Grade View: Grade II Tube type: Oral Tube size: 8.0 mm Number of attempts: 1 Airway Equipment and Method: Stylet Placement Confirmation: ETT inserted through vocal cords under direct vision,  positive ETCO2 and breath sounds checked- equal and bilateral Secured at: 22 cm Tube secured with: Tape Dental Injury: Teeth and Oropharynx as per pre-operative assessment

## 2020-09-05 NOTE — Progress Notes (Addendum)
Pt c/o 9/10 Pain in right lower back that radiates to right leg. Dr. Ola Spurr notified. Verbal order received for 1000 mg acetaminophen (TYLENOL) PO and Fentnyl 50 mcg IV. See MAR. Pt placed on monitor and 2L O2.

## 2020-09-05 NOTE — Op Note (Signed)
Brief history: The patient is a 65 year old black male who has complained of back and right leg pain consistent with a lumbosacral radiculopathy.  He has failed medical management.  He was worked up with a lumbar MRI which demonstrated a large synovial cyst at L5-S1 on the right.  I discussed the various treatment options.  He has weighed the risks, benefits and alternatives and decided to proceed with surgery.  Preoperative diagnosis: Right L5-S1 synovial cyst, lumbar and lumbosacral radiculopathy  Postoperative diagnosis: The same  Procedure: Right L5-S1 hemilaminectomy, right L4-5 laminotomy for resection of synovial cyst using microdissection   Surgeon: Dr. Earle Gell  Asst.: Arnetha Massy, NP  Anesthesia: Gen. endotracheal  Estimated blood loss: 75 cc  Drains: None  Complications: None  Description of procedure: The patient was brought to the operating room by the anesthesia team. General endotracheal anesthesia was induced. The patient was turned to the prone position on the Wilson frame. The patient's lumbosacral region was then prepared with Betadine scrub and Betadine solution. Sterile drapes were applied.  I then injected the area to be incised with Marcaine with epinephrine solution. I then used a scalpel to make a linear midline incision over the L5-S1 intervertebral disc space. I then used electrocautery to perform a right sided subperiosteal dissection exposing the spinous process and lamina of L4, L5 and S1. We obtained intraoperative radiograph to confirm our location. I then inserted the [facility] retractor for exposure.  We then brought the operative microscope into the field. Under its magnification and illumination we completed the microdissection. I used a high-speed drill to perform a laminotomy at L4-5 and L5-S1. I then used a Kerrison punches to complete the hemilaminectomy and L5-S1 and widen the laminotomy at L4-5 and removed the ligamentum flavum at L4-5 and  L5-S1. We then used microdissection to free up the thecal sac and the right L5 and S1 nerve root from the epidural tissue.  As expected we encountered a large synovial cyst which was adherent to the dura.  We freed it up from the dura using microdissection.  We removed in multiple fragments using the Kerrison punches.  I then used a Kerrison punch to perform a foraminotomy at about the L5 and S1 nerve root.  We inspected the intervertebral disc at L5-S1.  There were no herniations.  I then palpated along the ventral surface of the thecal sac and along exit route of the right L5 and S1 nerve root and noted that the neural structures were well decompressed. This completed the decompression.  We then obtained hemostasis using bipolar electrocautery. We irrigated the wound out with bacitracin solution. We then removed the retractor. We then reapproximated the patient's thoracolumbar fascia with interrupted #1 Vicryl suture. We then reapproximated the patient's subcutaneous tissue with interrupted 2-0 Vicryl suture. We then reapproximated patient's skin with Steri-Strips and benzoin. The was then coated with bacitracin ointment. The drapes were removed. The patient was subsequently returned to the supine position where they were extubated by the anesthesia team. The patient was then transported to the postanesthesia care unit in stable condition. All sponge instrument and needle counts were reportedly correct at the end of this case.

## 2020-09-06 ENCOUNTER — Encounter (HOSPITAL_COMMUNITY): Payer: Self-pay | Admitting: Neurosurgery

## 2020-09-06 DIAGNOSIS — M7138 Other bursal cyst, other site: Secondary | ICD-10-CM | POA: Diagnosis not present

## 2020-09-06 DIAGNOSIS — M5417 Radiculopathy, lumbosacral region: Secondary | ICD-10-CM | POA: Diagnosis not present

## 2020-09-06 DIAGNOSIS — Z79899 Other long term (current) drug therapy: Secondary | ICD-10-CM | POA: Diagnosis not present

## 2020-09-06 DIAGNOSIS — Z8673 Personal history of transient ischemic attack (TIA), and cerebral infarction without residual deficits: Secondary | ICD-10-CM | POA: Diagnosis not present

## 2020-09-06 DIAGNOSIS — Z85828 Personal history of other malignant neoplasm of skin: Secondary | ICD-10-CM | POA: Diagnosis not present

## 2020-09-06 DIAGNOSIS — Z7982 Long term (current) use of aspirin: Secondary | ICD-10-CM | POA: Diagnosis not present

## 2020-09-06 DIAGNOSIS — Z7951 Long term (current) use of inhaled steroids: Secondary | ICD-10-CM | POA: Diagnosis not present

## 2020-09-06 DIAGNOSIS — Z87891 Personal history of nicotine dependence: Secondary | ICD-10-CM | POA: Diagnosis not present

## 2020-09-06 MED ORDER — OXYCODONE-ACETAMINOPHEN 5-325 MG PO TABS: 1.0000 | ORAL_TABLET | ORAL | 0 refills | Status: DC | PRN

## 2020-09-06 MED ORDER — GABAPENTIN 300 MG PO CAPS: 300.0000 mg | ORAL_CAPSULE | Freq: Three times a day (TID) | ORAL | 1 refills | Status: DC

## 2020-09-06 MED ORDER — DOCUSATE SODIUM 100 MG PO CAPS: 100.0000 mg | ORAL_CAPSULE | Freq: Two times a day (BID) | ORAL | 0 refills | Status: DC

## 2020-09-06 MED ORDER — OXYCODONE-ACETAMINOPHEN 5-325 MG PO TABS
1.0000 | ORAL_TABLET | ORAL | Status: DC | PRN
  Administered 2020-09-06: 2 via ORAL
  Filled 2020-09-06: qty 2

## 2020-09-06 NOTE — Discharge Summary (Signed)
Physician Discharge Summary  Patient ID: Sean Ramos MRN: [id number] DOB/AGE: 1956-01-09 65 y.o.  Admit date: 09/05/2020 Discharge date: 09/06/2020  Admission Diagnoses: Lumbosacral synovial cyst, lumbosacral radiculopathy, lumbago  Discharge Diagnoses: The same Active Problems:   Synovial cyst of lumbar facet joint   Discharged Condition: good  Hospital Course: I performed a right L4-5 laminotomy and L5-S1 hemilaminectomy for resection of synovial cyst on the patient on 09/05/2020.  The surgery went well.  The patient's postoperative course was unremarkable.  On postoperative day #1 he requested discharge home.  He was given oral and written discharge instructions.  All his questions were answered.  Consults: PT, OT, care management Significant Diagnostic Studies: None Treatments: Right L4-5 laminotomy, right L5-S1 hemilaminectomy for resection of synovial cyst using microdissection Discharge Exam: Blood pressure (!) 143/92, pulse (!) 114, temperature 98.3 F (36.8 C), temperature source Oral, resp. rate 18, height 5\' 6"  (1.676 m), weight 100.7 kg, SpO2 94 %. The patient is alert and pleasant.  His strength is normal.  His right leg pain is gone.  His dressing has a small bloodstain.  Disposition: Home  Discharge Instructions    Call MD for:  difficulty breathing, headache or visual disturbances   Complete by: As directed    Call MD for:  extreme fatigue   Complete by: As directed    Call MD for:  hives   Complete by: As directed    Call MD for:  persistant dizziness or light-headedness   Complete by: As directed    Call MD for:  persistant nausea and vomiting   Complete by: As directed    Call MD for:  redness, tenderness, or signs of infection (pain, swelling, redness, odor or green/yellow discharge around incision site)   Complete by: As directed    Call MD for:  severe uncontrolled pain   Complete by: As directed    Call MD for:  temperature >100.4   Complete by: As  directed    Diet - low sodium heart healthy   Complete by: As directed    Discharge instructions   Complete by: As directed    Call [phone] for a followup appointment. Take a stool softener while you are using pain medications.   Driving Restrictions   Complete by: As directed    Do not drive for 2 weeks.   Increase activity slowly   Complete by: As directed    Lifting restrictions   Complete by: As directed    Do not lift more than 5 pounds. No excessive bending or twisting.   May shower / Bathe   Complete by: As directed    Remove the dressing for 3 days after surgery.  You may shower, but leave the incision alone.   Remove dressing in 48 hours   Complete by: As directed      Allergies as of 09/06/2020   No Known Allergies     Medication List    TAKE these medications   albuterol 108 (90 Base) MCG/ACT inhaler Commonly known as: VENTOLIN HFA Inhale 2 puffs into the lungs every 6 (six) hours as needed for wheezing or shortness of breath.   aspirin 81 MG tablet Take 81 mg by mouth daily.   clonazePAM 0.5 MG tablet Commonly known as: KLONOPIN Take 0.5 mg by mouth 3 (three) times daily as needed for anxiety.   docusate sodium 100 MG capsule Commonly known as: COLACE Take 1 capsule (100 mg total) by mouth 2 (two) times daily.  gabapentin 300 MG capsule Commonly known as: NEURONTIN Take 1 capsule (300 mg total) by mouth 3 (three) times daily.   hydrochlorothiazide 25 MG tablet Commonly known as: HYDRODIURIL Take 25 mg by mouth daily.   ipratropium-albuterol 0.5-2.5 (3) MG/3ML Soln Commonly known as: DUONEB Take 3 mLs by nebulization every 4 (four) hours as needed (Asthma).   NIFEdipine 90 MG 24 hr tablet Commonly known as: PROCARDIA XL/NIFEDICAL-XL Take 90 mg by mouth daily.   Omega-3 1000 MG Caps Take 1,000 mg by mouth daily.   oxyCODONE-acetaminophen 5-325 MG tablet Commonly known as: PERCOCET/ROXICET Take 1-2 tablets by mouth every 4 (four) hours as  needed for moderate pain.   potassium chloride SA 20 MEQ tablet Commonly known as: KLOR-CON Take 20 mEq by mouth daily.   simvastatin 20 MG tablet Commonly known as: ZOCOR Take 20 mg by mouth daily.   terazosin 5 MG capsule Commonly known as: HYTRIN Take 5 mg by mouth at bedtime.   tiZANidine 4 MG tablet Commonly known as: ZANAFLEX Take 4 mg by mouth every 6 (six) hours as needed for muscle spasms.   traZODone 150 MG tablet Commonly known as: DESYREL Take 150 mg by mouth at bedtime.   Trelegy Ellipta 100-62.5-25 MCG/INH Aepb Generic drug: Fluticasone-Umeclidin-Vilant Inhale 1 puff into the lungs daily.   Trelegy Ellipta 100-62.5-25 MCG/INH Aepb Generic drug: Fluticasone-Umeclidin-Vilant Inhale 1 puff into the lungs daily.   valsartan 320 MG tablet Commonly known as: DIOVAN Take 320 mg by mouth daily.   venlafaxine XR 150 MG 24 hr capsule Commonly known as: EFFEXOR-XR Take 150 mg by mouth daily with breakfast.        Signed: Ophelia Charter 09/06/2020, 7:50 AM

## 2020-09-06 NOTE — Evaluation (Signed)
Physical Therapy Evaluation Patient Details Name: Sean Ramos MRN: [id number] DOB: Mar 06, 1956 Today's Date: 09/06/2020   History of Present Illness  Pt is a 65 y/o male who presents s/p L5-S1 hemilaminectomy and L4-5 laminotomy for resection of synovial cyst on 09/05/20. PMH significant for asthma, skin CA, COPD, depression.  Clinical Impression  Pt admitted with above diagnosis. At the time of PT eval, pt was able to demonstrate transfers and ambulation with gross min guard assist to supervision for safety and [facility] for support. Pt typically utilizes a QC at home. Pt was educated on precautions and car transfer. Pt willing to participate with minimal mobility. Encouraged to progress mobility and ambulation distance as able at home. Pt currently with functional limitations due to the deficits listed below (see PT Problem List). Pt will benefit from skilled PT to increase their independence and safety with mobility to allow discharge to the venue listed below.      Follow Up Recommendations No PT follow up;Supervision for mobility/OOB    Equipment Recommendations  None recommended by PT    Recommendations for Other Services       Precautions / Restrictions Precautions Precautions: Back Precaution Booklet Issued: Yes (comment) Restrictions Weight Bearing Restrictions: No      Mobility  Bed Mobility Overal bed mobility: Modified Independent             General bed mobility comments: Pt was received sitting up EOB. Verbally reviewed log roll technique.    Transfers Overall transfer level: Modified independent Equipment used: Straight cane             General transfer comment: VC's for optimal posture.  Ambulation/Gait Ambulation/Gait assistance: Min guard;Supervision Gait Distance (Feet): 200 Feet Assistive device: Straight cane Gait Pattern/deviations: Step-through pattern;Decreased stride length;Trunk flexed Gait velocity: Decreased Gait velocity  interpretation: <1.8 ft/sec, indicate of risk for recurrent falls General Gait Details: VC's for improved posture. Overall good sequencing with the SPC. Pt utilized a QC at baseline. No overt LOB noted.  Stairs         General stair comments: Pt declined stair training despite max encouragement.  Wheelchair Mobility    Modified Rankin (Stroke Patients Only)       Balance Overall balance assessment: Mild deficits observed, not formally tested                                           Pertinent Vitals/Pain Pain Assessment: Faces Faces Pain Scale: Hurts even more Pain Location: Incision site Pain Descriptors / Indicators: Operative site guarding;Sore Pain Intervention(s): Limited activity within patient's tolerance;Monitored during session;Repositioned    Home Living Family/patient expects to be discharged to:: Private residence Living Arrangements: Spouse/significant other Available Help at Discharge: Family;Available 24 hours/day Type of Home: House Home Access: Stairs to enter Entrance Stairs-Rails: Can reach both Entrance Stairs-Number of Steps: 3 Home Layout: One level Home Equipment: Cane - quad      Prior Function Level of Independence: Independent               Hand Dominance   Dominant Hand: Right    Extremity/Trunk Assessment   Upper Extremity Assessment Upper Extremity Assessment: Defer to OT evaluation    Lower Extremity Assessment Lower Extremity Assessment: Generalized weakness (Consistent with pre-op diagnosis and generally sedentary lifestyle.)    Cervical / Trunk Assessment Cervical / Trunk Assessment: Other exceptions Cervical / Trunk  Exceptions: spinal surgery  Communication   Communication: No difficulties  Cognition Arousal/Alertness: Awake/alert Behavior During Therapy: WFL for tasks assessed/performed Overall Cognitive Status: Within Functional Limits for tasks assessed                                         General Comments      Exercises     Assessment/Plan    PT Assessment Patient needs continued PT services  PT Problem List Decreased strength;Decreased activity tolerance;Decreased balance;Decreased mobility;Decreased knowledge of use of DME;Decreased safety awareness;Decreased knowledge of precautions;Pain       PT Treatment Interventions DME instruction;Gait training;Stair training;Functional mobility training;Therapeutic activities;Therapeutic exercise;Neuromuscular re-education;Patient/family education    PT Goals (Current goals can be found in the Care Plan section)  Acute Rehab PT Goals Patient Stated Goal: return home and recover PT Goal Formulation: With patient Time For Goal Achievement: 09/13/20 Potential to Achieve Goals: Good    Frequency Min 5X/week   Barriers to discharge        Co-evaluation               AM-PAC PT "6 Clicks" Mobility  Outcome Measure Help needed turning from your back to your side while in a flat bed without using bedrails?: None Help needed moving from lying on your back to sitting on the side of a flat bed without using bedrails?: A Little Help needed moving to and from a bed to a chair (including a wheelchair)?: A Little Help needed standing up from a chair using your arms (e.g., wheelchair or bedside chair)?: A Little Help needed to walk in hospital room?: A Little Help needed climbing 3-5 steps with a railing? : A Little 6 Click Score: 19    End of Session Equipment Utilized During Treatment: Gait belt Activity Tolerance: Patient limited by fatigue Patient left: with call bell/phone within reach (Sitting EOB) Nurse Communication: Mobility status PT Visit Diagnosis: Unsteadiness on feet (R26.81);Pain Pain - part of body:  (back)    Time: 8828-0034 PT Time Calculation (min) (ACUTE ONLY): 15 min   Charges:   PT Evaluation $PT Eval Low Complexity: 1 Low          Rolinda Roan, PT, DPT Acute  Rehabilitation Services Pager: [phone] Office: [phone]   Thelma Comp 09/06/2020, 10:02 AM

## 2020-09-06 NOTE — Progress Notes (Signed)
Patient alert and oriented, voiding adequately, MAE well with no difficulty. Incision area cdi with no s/s of infection. Patient discharged home per order. Patient and spouse stated understanding of discharge instructions given. Patient has an appointment with Dr. Arnoldo Morale.

## 2020-09-06 NOTE — Evaluation (Signed)
Occupational Therapy Evaluation Patient Details Name: Sean Ramos MRN: [id number] DOB: 08-Feb-1956 Today's Date: 09/06/2020    History of Present Illness 65 YO M undewent L4-5 laminotomy and L5-S1 hemilaminectomy for resection of synovial cyst 6/6.  PMH includes:Anxiety     . Arthritis    . Asthma    . Cancer ([place])      skin cancer on right thumb  . COPD (chronic obstructive pulmonary disease) ([place])    . Depression    . Hyperlipidemia.   Clinical Impression   Patient admitted for diagnosis and procedure above.  PTA he lives with his spouse who can assist as needed.  Currently he is close to his baseline for basic mobility and ADL completion.  He did use a quad cane at baseline.  No further OT needs.      Follow Up Recommendations  No OT follow up    Equipment Recommendations  Tub/shower seat    Recommendations for Other Services       Precautions / Restrictions Precautions Precautions: Back Precaution Booklet Issued: Yes (comment) Restrictions Weight Bearing Restrictions: No      Mobility Bed Mobility Overal bed mobility: Modified Independent               Patient Response: Cooperative  Transfers Overall transfer level: Modified independent                    Balance Overall balance assessment: Mild deficits observed, not formally tested                                         ADL either performed or assessed with clinical judgement   ADL Overall ADL's : At baseline                                             Vision Baseline Vision/History: Wears glasses Patient Visual Report: No change from baseline       Perception     Praxis      Pertinent Vitals/Pain Pain Assessment: Faces Faces Pain Scale: Hurts even more Pain Location: surgical site Pain Descriptors / Indicators: Operative site guarding Pain Intervention(s): Monitored during session     Hand Dominance Right   Extremity/Trunk Assessment  Upper Extremity Assessment Upper Extremity Assessment: Overall WFL for tasks assessed   Lower Extremity Assessment Lower Extremity Assessment: Defer to PT evaluation   Cervical / Trunk Assessment Cervical / Trunk Assessment: Other exceptions Cervical / Trunk Exceptions: spinal surgery   Communication Communication Communication: No difficulties   Cognition Arousal/Alertness: Awake/alert Behavior During Therapy: WFL for tasks assessed/performed Overall Cognitive Status: Within Functional Limits for tasks assessed                                     General Comments       Exercises     Shoulder Instructions      Home Living Family/patient expects to be discharged to:: Private residence Living Arrangements: Spouse/significant other Available Help at Discharge: Family;Available 24 hours/day Type of Home: House Home Access: Stairs to enter [employer] of Steps: 3 Entrance Stairs-Rails: Can reach both Home Layout: One level     Bathroom Shower/Tub: Tub/shower unit;Walk-in  shower   Bathroom Toilet: Standard     Home Equipment: Cane - quad          Prior Functioning/Environment Level of Independence: Independent                 OT Problem List: Impaired balance (sitting and/or standing);Pain      OT Treatment/Interventions:      OT Goals(Current goals can be found in the care plan section) Acute Rehab OT Goals Patient Stated Goal: return home and recover OT Goal Formulation: With patient Time For Goal Achievement: 09/06/20 Potential to Achieve Goals: Good  OT Frequency:     Barriers to D/C:            Co-evaluation              AM-PAC OT "6 Clicks" Daily Activity     Outcome Measure Help from another person eating meals?: None Help from another person taking care of personal grooming?: None Help from another person toileting, which includes using toliet, bedpan, or urinal?: None Help from another person bathing  (including washing, rinsing, drying)?: None Help from another person to put on and taking off regular upper body clothing?: None Help from another person to put on and taking off regular lower body clothing?: None 6 Click Score: 24   End of Session Nurse Communication: Mobility status  Activity Tolerance: Patient tolerated treatment well Patient left: in bed  OT Visit Diagnosis: Unsteadiness on feet (R26.81)                Time: 6378-5885 OT Time Calculation (min): 22 min Charges:  OT General Charges $OT Visit: 1 Visit OT Evaluation $OT Eval Moderate Complexity: 1 Mod  09/06/2020  Rich, OTR/L  Acute Rehabilitation Services  Office:  [phone]   Metta Clines 09/06/2020, 8:27 AM

## 2020-09-06 NOTE — Anesthesia Postprocedure Evaluation (Signed)
Anesthesia Post Note  Patient: Sean Ramos  Procedure(s) Performed: LAMINECTOMY LUMBAR FIVE  - SACRAL ONE (N/A Spine Lumbar)     Patient location during evaluation: Other Anesthesia Type: General Level of consciousness: awake and alert Pain management: pain level controlled Vital Signs Assessment: post-procedure vital signs reviewed and stable Respiratory status: spontaneous breathing, nonlabored ventilation and respiratory function stable Cardiovascular status: blood pressure returned to baseline and stable Postop Assessment: no apparent nausea or vomiting Anesthetic complications: no   No complications documented.  Last Vitals:  Vitals:   09/06/20 0543 09/06/20 0728  BP: (!) 142/89 (!) 143/92  Pulse: (!) 108 (!) 114  Resp: 18 18  Temp: 36.6 C 36.8 C  SpO2: 99% 94%    Last Pain:  Vitals:   09/06/20 0728  TempSrc: Oral  PainSc:                  Erline Siddoway,W. EDMOND

## 2020-09-21 DIAGNOSIS — Z299 Encounter for prophylactic measures, unspecified: Secondary | ICD-10-CM | POA: Diagnosis not present

## 2020-09-21 DIAGNOSIS — Z6837 Body mass index (BMI) 37.0-37.9, adult: Secondary | ICD-10-CM | POA: Diagnosis not present

## 2020-09-21 DIAGNOSIS — N1831 Chronic kidney disease, stage 3a: Secondary | ICD-10-CM | POA: Diagnosis not present

## 2020-09-21 DIAGNOSIS — M549 Dorsalgia, unspecified: Secondary | ICD-10-CM | POA: Diagnosis not present

## 2020-09-28 DIAGNOSIS — Z20828 Contact with and (suspected) exposure to other viral communicable diseases: Secondary | ICD-10-CM | POA: Diagnosis not present

## 2020-10-05 DIAGNOSIS — M25531 Pain in right wrist: Secondary | ICD-10-CM | POA: Diagnosis not present

## 2020-10-05 DIAGNOSIS — M1711 Unilateral primary osteoarthritis, right knee: Secondary | ICD-10-CM | POA: Diagnosis not present

## 2020-10-05 DIAGNOSIS — M545 Low back pain, unspecified: Secondary | ICD-10-CM | POA: Diagnosis not present

## 2020-11-15 DIAGNOSIS — J449 Chronic obstructive pulmonary disease, unspecified: Secondary | ICD-10-CM | POA: Diagnosis not present

## 2020-11-15 DIAGNOSIS — Z713 Dietary counseling and surveillance: Secondary | ICD-10-CM | POA: Diagnosis not present

## 2020-11-15 DIAGNOSIS — Z6837 Body mass index (BMI) 37.0-37.9, adult: Secondary | ICD-10-CM | POA: Diagnosis not present

## 2020-11-15 DIAGNOSIS — Z299 Encounter for prophylactic measures, unspecified: Secondary | ICD-10-CM | POA: Diagnosis not present

## 2020-11-15 DIAGNOSIS — R252 Cramp and spasm: Secondary | ICD-10-CM | POA: Diagnosis not present

## 2020-11-30 ENCOUNTER — Other Ambulatory Visit: Payer: Self-pay

## 2020-11-30 ENCOUNTER — Ambulatory Visit (INDEPENDENT_AMBULATORY_CARE_PROVIDER_SITE_OTHER): Payer: Medicare HMO | Admitting: Internal Medicine

## 2020-11-30 ENCOUNTER — Encounter: Payer: Self-pay | Admitting: Internal Medicine

## 2020-11-30 DIAGNOSIS — J449 Chronic obstructive pulmonary disease, unspecified: Secondary | ICD-10-CM | POA: Diagnosis not present

## 2020-11-30 MED ORDER — FLUTICASONE-SALMETEROL 250-50 MCG/ACT IN AEPB: INHALATION_SPRAY | RESPIRATORY_TRACT | 11 refills | Status: DC

## 2020-11-30 MED ORDER — FLUTICASONE FUROATE-VILANTEROL 100-25 MCG/INH IN AEPB
1.0000 | INHALATION_SPRAY | Freq: Every day | RESPIRATORY_TRACT | 0 refills | Status: DC
Start: 2020-11-30 — End: 2022-02-20

## 2020-11-30 NOTE — Progress Notes (Signed)
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 Age 65 ? Dx ? And reports  flunked pfts for job around 2000 and on disability and since then  dx as copd in [place] 2011 p seen in pulmonary clinic 04/28/10 and referred back by Dr Anthonette Legato 08/31/2015  With gold III cpd criteria in Jan 2012     History of Present Illness  April 28, 2010  1st pulmonary office eval ov cc doe and cough onset in 40's and progressed to point where doe shopping leans on cart and struggles to get groceries back to his house (lives next to store). has noct wheeze disturbs sleep.  can't tell if dulera helpiing at this point not really using it. Dx gold III/IV copd  rec dulera 100 2bid / prn saba  Stop smoking   08/31/2015 1st EPIC  [facility] office visit//  Rikki Trosper  On symbicort 160 poor hfa  Chief Complaint  Patient presents with   Pulmonary Consult    Referred by Dr. Manuella Ghazi. Pt seen here in the past- last in 2012. He states that his breathing has been worse over the past few months. He states that he gets SOB walking "not far" and also when he gets upset about something.   wakes up coughing/ congested each am/bad days  can't walk across house s sob/other days does ok very slow to mailbox slt up hill back to house. Sob at rest when gets upset / some better with saba hfa and neb  rec Plan A = Automatic = Symbicort 160 Take 2 puffs first thing in am and then another 2 puffs about 12 hours later.  Spiriva 2 pffs each am (like high octane)  Plan B = Backup Only use your albuterol (proair) as a rescue medication  Plan C = Crisis - only use your albuterol nebulizer if you first try Plan B and it fails  Think of the e cigs as a one way bridge off all tobacco products      09/28/2015  f/u ov/Yanira Tolsma re: GOLDI III/IV copd/ still smoking rx  Symbicort/ spiriva respimat  02 prn   Chief Complaint  Patient presents with   Follow-up    Breathing is unchanged. No new co's today. He is using proair 1 x daily on average and neb 2 x daily on average.   MMRC3  = can't walk 100 yards even at a slow pace at a flat grade s stopping due to sob  rec  Plan A = Automatic = Symbicort 160 Take 2 puffs first thing in am and then another 2 puffs about 12 hours later.                                      Spiriva 2 pffs each am (like high octane fuel)  Plan B = Backup Only use your albuterol (proair) as a rescue medication   Plan C = Crisis - only use your albuterol nebulizer if you first try Plan B     10/23/2019  f/u ov/Percell Lamboy re: GOLD III/ IV  -self referral / smoking e cigs and maint breo 100 / re-establish Chief Complaint  Patient presents with   Consult  Patient last seen in 2017. Patient has COPD and shortness of breath all the time exertion makes it worse. Has a cough but can't get up mucus.   Dyspnea:  mb is now a struggle/and back about 100 ft  Cough: some am cough /congestion > minimal mucoid   Sleeping: flat bed with pillows  SABA use: once a week / very rarely neb  02: none  rec I do recommned low dose Chest CT for the next 11 years  - contract [name] NP at the [place] office to arrange  Wean off vapes if possible  Plan A = Automatic = Always=   Breztri Take 2 puffs first thing in am and then another 2 puffs about 12 hours later.  Work on inhaler technique:  Plan B = Backup (to supplement plan A, not to replace it) Only use your albuterol (ventolin)  inhaler Plan C = Crisis (instead of Plan B but only if Plan B stops working) - only use your albuterol nebulizer if you first try Plan B and it fails to help > ok to use the nebulizer up to every 4 hours but if start needing it regularly call for immediate appointment Try albuterol 15 min before an activity that you know would make you short of breath and see if it makes any difference and if makes none then  don't take it after activity unless you can't catch your breath.    Please schedule a follow up office visit in 6-8  Weeks with PFTS with breztri x 2 puffs that am  = GOLD 3 p "3 inhalers"    12/11/2019  f/u ov/[name] office/Arryn Terrones re: GOLD 3  Chief Complaint  Patient presents with   Follow-up    non-productive cough, shortness of breath with activity  Dyspnea:  No change mailbox and back, never pre challenges or rechallenges  Cough: dry cough Sleeping: poorly but not related to resp dz  SABA use: 2-3 x  02: None    01/21/2020  f/u ov/[place] office/Ivonna Kinnick re: copd  GOLD III/ breztri/ still vapes Chief Complaint  Patient presents with   Follow-up    Breathing is about the same since the last visit. He is using his albuterol inhaler 3 x per day on average.   Dyspnea:  mb and back is easier Cough: none  Sleeping: no resp problem on flat bed x 3 pillows  SABA use: way too much  02: none  Rec Plan A = Automatic = Always=    Trelegy one click each am  Plan B = Backup (to supplement plan A, not to replace it) Only use your albuterol inhaler as a rescue medication  Plan C = Crisis (instead of Plan B but only if Plan B stops working) - only use your albuterol nebulizer if you first try Plan B and it fails to help > ok to use the nebulizer up to every 4 hours but if start needing it regularly call for immediate appointment Try albuterol 15 min before an activity that you know would make you short of breath and see if it makes any difference and if makes none then don't take it after activity unless you can't catch your breath Please schedule a follow up visit in 3 months but call sooner if needed     02/2020 COVID 19 infection    11/30/2020  f/u ov/[name] office/Toneisha Savary re: GOLD III/ vapes stil /  maint on prn saba  as can't afford trelegy  Chief Complaint  Patient presents with   Follow-up    Breathing progressively worse since last visit. He continues to vape. Could not afford  trelegy.    Dyspnea:  better on Trelegy but can't affored  Cough: none  Sleeping: no resp problems flat bed with pillows  SABA use: bid albuterol 02: none  Covid status: vax x 3      No obvious day to day or daytime variability or assoc excess/ purulent sputum or mucus plugs or hemoptysis or cp or chest tightness, subjective wheeze or overt sinus or hb symptoms.   Sleeping ok without nocturnal  or early am exacerbation  of respiratory  c/o's or need for noct saba. Also denies any obvious fluctuation of symptoms with weather or environmental changes or other aggravating or alleviating factors except as outlined above   No unusual exposure hx or h/o childhood pna/ asthma or knowledge of premature birth.  Current Allergies, Complete Past Medical History, Past Surgical History, Family History, and Social History were reviewed in [employer] record.  ROS  The following are not active complaints unless bolded Hoarseness, sore throat, dysphagia, dental problems, itching, sneezing,  nasal congestion or discharge of excess mucus or purulent secretions, ear ache,   fever, chills, sweats, unintended wt loss or wt gain, classically pleuritic or exertional cp,  orthopnea pnd or arm/hand swelling  or leg swelling, presyncope, palpitations, abdominal pain, anorexia, nausea, vomiting, diarrhea  or change in bowel habits or change in bladder habits, change in stools or change in urine, dysuria, hematuria,  rash, arthralgias, visual complaints, headache, numbness, weakness or ataxia or problems with walking or coordination,  change in mood or  memory.        Current Meds  Medication Sig   albuterol (VENTOLIN HFA) 108 (90 Base) MCG/ACT inhaler Inhale 2 puffs into the lungs every 6 (six) hours as needed for wheezing or shortness of breath.   aspirin 81 MG tablet Take 81 mg by mouth daily.   clonazePAM (KLONOPIN) 0.5 MG tablet Take 0.5 mg by mouth 3 (three) times daily as needed for  anxiety.   gabapentin (NEURONTIN) 300 MG capsule Take 1 capsule (300 mg total) by mouth 3 (three) times daily.   hydrochlorothiazide (HYDRODIURIL) 25 MG tablet Take 25 mg by mouth daily.   ipratropium-albuterol (DUONEB) 0.5-2.5 (3) MG/3ML SOLN Take 3 mLs by nebulization every 4 (four) hours as needed (Asthma).   NIFEdipine (PROCARDIA XL/NIFEDICAL-XL) 90 MG 24 hr tablet Take 90 mg by mouth daily.   Omega-3 1000 MG CAPS Take 1,000 mg by mouth daily.   potassium chloride SA (K-DUR,KLOR-CON) 20 MEQ tablet Take 20 mEq by mouth daily.   simvastatin (ZOCOR) 20 MG tablet Take 20 mg by mouth daily.   terazosin (HYTRIN) 5 MG capsule Take 5 mg by mouth at bedtime.   traZODone (DESYREL) 150 MG tablet Take 150 mg by mouth at bedtime.   valsartan (DIOVAN) 320 MG tablet Take 320 mg by mouth daily.   venlafaxine XR (EFFEXOR-XR) 150 MG 24 hr capsule Take 150 mg by mouth daily with breakfast.            Objective:   Physical Exam   11/30/2020       215  01/21/2020     218  12/11/2019       215  10/23/2019       215   09/28/15 198 lb (89.812 kg)  08/31/15 197 lb (89.359 kg)  04/28/10 200 lb 8 oz (90.946 kg)  Vital signs reviewed  11/30/2020  - Note at rest 02 sats  94% on RA   General appearance:    chronically ill amb bm/ uses cane   HEENT : pt wearing mask not removed for exam due to covid -19 concerns.    NECK :  without JVD/Nodes/TM/ nl carotid upstrokes bilaterally   LUNGS: no acc muscle use,  Mod barrel  contour chest wall with bilateral  Distant bs s audible wheeze and  without cough on insp or exp maneuvers and mod  Hyperresonant  to  percussion bilaterally     CV:  RRR  no s3 or murmur or increase in P2, and no edema   ABD:  soft and nontender with pos mid insp Hoover's  in the supine position. No bruits or organomegaly appreciated, bowel sounds nl  MS:     ext warm without deformities, calf tenderness, cyanosis or clubbing No obvious joint restrictions   SKIN: warm and dry  without lesions    NEURO:  alert, approp, nl sensorium with  no motor or cerebellar deficits apparent.        Assessment:

## 2020-11-30 NOTE — Patient Instructions (Addendum)
Plan A = Automatic = Always=    advair 250 one twice daily (Breo 100 one daily sample x 14 d)   Rinse and gargle after use   Plan B = Backup (to supplement plan A, not to replace it) Only use your albuterol inhaler as a rescue medication to be used if you can't catch your breath by resting or doing a relaxed purse lip breathing pattern.  - The less you use it, the better it will work when you need it. - Ok to use the inhaler up to 2 puffs  every 4 hours if you must but call for appointment if use goes up over your usual need - Don't leave home without it !!  (think of it like the spare tire for your car)   Plan C = Crisis (instead of Plan B but only if Plan B stops working) - only use your albuterol nebulizer if you first try Plan B and it fails to help > ok to use the nebulizer up to every 4 hours but if start needing it regularly call for immediate appointment    Please schedule a follow up visit in 3 months but call sooner if needed

## 2020-12-01 ENCOUNTER — Telehealth: Payer: Self-pay | Admitting: Internal Medicine

## 2020-12-01 ENCOUNTER — Encounter: Payer: Self-pay | Admitting: Internal Medicine

## 2020-12-01 NOTE — Assessment & Plan Note (Signed)
Quit smoking 09/2015 PFT's April 28, 2010 FEV1 1.03 (33%) ratio 45 and no better p B2, DLC0 65% - Stopped working Apr 21 2015  - 08/31/2015   try add spiriva resp to sym 160  - 08/31/2015   Walked RA x one lap @ 185 stopped due to  Sob, nl pace, no desat  PFT's  09/02/15 FEV1 0.88(32%) ratio 43 p 15% improvement from saba p symbicort 160 / spiriva prior ith DLCO  38 corrects to 81 for alv volume   - 09/05/2015 completed disability paperwork  - 09/28/2015 completed additional disability paperwork  - 10/23/2019  After extensive coaching inhaler device,  effectiveness =    50% > change breo to breztri  - PFT's 11/25/19   FEV1 0.81 (31 % ) ratio 0.51  p 0 % improvement from saba p  "3 inhalers"   prior to study with DLCO  13.48 (56%) corrects to 4.35 (105%)  for alv volume and FV curve classic severe concavity   -  12/11/2019   Walked RA  approx   400 ft  @ moderat pace  stopped due to  End of study c/o sob with sats still    Feet with sats still 96%  - 01/21/2020  After extensive coaching inhaler device,  effectiveness =    90% p coaching with dpi > try trelegy x 2 weeks as still too saba dep on breztri > no better on trelegy so back on Breztri> can't afford either - 11/30/2020  After extensive coaching inhaler device,  effectiveness =    90% dpi > tri advair 250 bid(or generic version = wixella)    Group D in terms of symptom/risk and laba/lama/ICS  therefore appropriate rx at this point >>>  Can't afford triple so ok to try generic laba/ics plus approp saba  Re saba I spent extra time with pt today reviewing appropriate use of albuterol for prn use on exertion with the following points: 1) saba is for relief of sob that does not improve by walking a slower pace or resting but rather if the pt does not improve after trying this first. 2) If the pt is convinced, as many are, that saba helps recover from activity faster then it's easy to tell if this is the case by re-challenging : ie stop, take the inhaler,  then p 5 minutes try the exact same activity (intensity of workload) that just caused the symptoms and see if they are substantially diminished or not after saba 3) if there is an activity that reproducibly causes the symptoms, try the saba 15 min before the activity on alternate days   If in fact the saba really does help, then fine to continue to use it prn but advised may need to look closer at the maintenance regimen being used to achieve better control of airways disease with exertion.          Each maintenance medication was reviewed in detail including emphasizing most importantly the difference between maintenance and prns and under what circumstances the prns are to be triggered using an action plan format where appropriate.  Total time for H and P, chart review, counseling, reviewing dpi device(s) and generating customized AVS unique to this office visit / same day charting = 22 min

## 2020-12-01 NOTE — Telephone Encounter (Signed)
Received fax from [facility]- drug change request  Fax states that the fluticasone/sal disk 250/50 is not preferred  Preferred meds listed are advair disk, symbicort, and breo   [facility] and spoke with pharmacy tech  She stated that the issue was that they did not have any advair 250 in stock yesterday but now they do  She did not say med was not covered or that it needed PA  Will close encounter

## 2020-12-17 DIAGNOSIS — I10 Essential (primary) hypertension: Secondary | ICD-10-CM | POA: Diagnosis not present

## 2020-12-17 DIAGNOSIS — J439 Emphysema, unspecified: Secondary | ICD-10-CM | POA: Diagnosis not present

## 2020-12-17 DIAGNOSIS — I951 Orthostatic hypotension: Secondary | ICD-10-CM | POA: Diagnosis not present

## 2020-12-17 DIAGNOSIS — G8929 Other chronic pain: Secondary | ICD-10-CM | POA: Diagnosis not present

## 2020-12-17 DIAGNOSIS — G4700 Insomnia, unspecified: Secondary | ICD-10-CM | POA: Diagnosis not present

## 2020-12-17 DIAGNOSIS — N529 Male erectile dysfunction, unspecified: Secondary | ICD-10-CM | POA: Diagnosis not present

## 2020-12-17 DIAGNOSIS — K219 Gastro-esophageal reflux disease without esophagitis: Secondary | ICD-10-CM | POA: Diagnosis not present

## 2020-12-17 DIAGNOSIS — M1990 Unspecified osteoarthritis, unspecified site: Secondary | ICD-10-CM | POA: Diagnosis not present

## 2020-12-17 DIAGNOSIS — R69 Illness, unspecified: Secondary | ICD-10-CM | POA: Diagnosis not present

## 2020-12-17 DIAGNOSIS — F331 Major depressive disorder, recurrent, moderate: Secondary | ICD-10-CM | POA: Diagnosis not present

## 2020-12-17 DIAGNOSIS — E669 Obesity, unspecified: Secondary | ICD-10-CM | POA: Diagnosis not present

## 2020-12-17 DIAGNOSIS — E785 Hyperlipidemia, unspecified: Secondary | ICD-10-CM | POA: Diagnosis not present

## 2020-12-17 DIAGNOSIS — F419 Anxiety disorder, unspecified: Secondary | ICD-10-CM | POA: Diagnosis not present

## 2020-12-27 DIAGNOSIS — F419 Anxiety disorder, unspecified: Secondary | ICD-10-CM | POA: Insufficient documentation

## 2020-12-27 DIAGNOSIS — Z8739 Personal history of other diseases of the musculoskeletal system and connective tissue: Secondary | ICD-10-CM | POA: Insufficient documentation

## 2020-12-27 DIAGNOSIS — N401 Enlarged prostate with lower urinary tract symptoms: Secondary | ICD-10-CM | POA: Diagnosis not present

## 2020-12-27 DIAGNOSIS — E785 Hyperlipidemia, unspecified: Secondary | ICD-10-CM | POA: Insufficient documentation

## 2020-12-27 DIAGNOSIS — N3281 Overactive bladder: Secondary | ICD-10-CM | POA: Diagnosis not present

## 2020-12-27 DIAGNOSIS — R69 Illness, unspecified: Secondary | ICD-10-CM | POA: Diagnosis not present

## 2020-12-28 DIAGNOSIS — E7800 Pure hypercholesterolemia, unspecified: Secondary | ICD-10-CM | POA: Diagnosis not present

## 2020-12-28 DIAGNOSIS — I700 Atherosclerosis of aorta: Secondary | ICD-10-CM | POA: Diagnosis not present

## 2020-12-28 DIAGNOSIS — N138 Other obstructive and reflux uropathy: Secondary | ICD-10-CM | POA: Diagnosis not present

## 2020-12-28 DIAGNOSIS — I10 Essential (primary) hypertension: Secondary | ICD-10-CM | POA: Diagnosis not present

## 2020-12-28 DIAGNOSIS — Z79899 Other long term (current) drug therapy: Secondary | ICD-10-CM | POA: Diagnosis not present

## 2020-12-28 DIAGNOSIS — M5451 Vertebrogenic low back pain: Secondary | ICD-10-CM | POA: Diagnosis not present

## 2020-12-28 DIAGNOSIS — J449 Chronic obstructive pulmonary disease, unspecified: Secondary | ICD-10-CM | POA: Diagnosis not present

## 2020-12-28 DIAGNOSIS — G8929 Other chronic pain: Secondary | ICD-10-CM | POA: Diagnosis not present

## 2020-12-28 DIAGNOSIS — N401 Enlarged prostate with lower urinary tract symptoms: Secondary | ICD-10-CM | POA: Diagnosis not present

## 2020-12-28 DIAGNOSIS — Z8673 Personal history of transient ischemic attack (TIA), and cerebral infarction without residual deficits: Secondary | ICD-10-CM | POA: Diagnosis not present

## 2020-12-28 DIAGNOSIS — Z9889 Other specified postprocedural states: Secondary | ICD-10-CM | POA: Diagnosis not present

## 2020-12-28 DIAGNOSIS — Z87891 Personal history of nicotine dependence: Secondary | ICD-10-CM | POA: Diagnosis not present

## 2020-12-28 DIAGNOSIS — M545 Low back pain, unspecified: Secondary | ICD-10-CM | POA: Diagnosis not present

## 2021-01-10 DIAGNOSIS — G8929 Other chronic pain: Secondary | ICD-10-CM | POA: Diagnosis not present

## 2021-01-10 DIAGNOSIS — Z6836 Body mass index (BMI) 36.0-36.9, adult: Secondary | ICD-10-CM | POA: Diagnosis not present

## 2021-01-10 DIAGNOSIS — M5442 Lumbago with sciatica, left side: Secondary | ICD-10-CM | POA: Diagnosis not present

## 2021-01-10 DIAGNOSIS — I10 Essential (primary) hypertension: Secondary | ICD-10-CM | POA: Diagnosis not present

## 2021-01-10 DIAGNOSIS — M5441 Lumbago with sciatica, right side: Secondary | ICD-10-CM | POA: Diagnosis not present

## 2021-01-11 DIAGNOSIS — Z23 Encounter for immunization: Secondary | ICD-10-CM | POA: Diagnosis not present

## 2021-01-11 DIAGNOSIS — Z6836 Body mass index (BMI) 36.0-36.9, adult: Secondary | ICD-10-CM | POA: Diagnosis not present

## 2021-01-11 DIAGNOSIS — I10 Essential (primary) hypertension: Secondary | ICD-10-CM | POA: Diagnosis not present

## 2021-01-11 DIAGNOSIS — M549 Dorsalgia, unspecified: Secondary | ICD-10-CM | POA: Diagnosis not present

## 2021-01-11 DIAGNOSIS — N400 Enlarged prostate without lower urinary tract symptoms: Secondary | ICD-10-CM | POA: Diagnosis not present

## 2021-01-11 DIAGNOSIS — I700 Atherosclerosis of aorta: Secondary | ICD-10-CM | POA: Diagnosis not present

## 2021-01-11 DIAGNOSIS — J440 Chronic obstructive pulmonary disease with acute lower respiratory infection: Secondary | ICD-10-CM | POA: Diagnosis not present

## 2021-01-11 DIAGNOSIS — Z299 Encounter for prophylactic measures, unspecified: Secondary | ICD-10-CM | POA: Diagnosis not present

## 2021-01-19 DIAGNOSIS — N528 Other male erectile dysfunction: Secondary | ICD-10-CM | POA: Diagnosis not present

## 2021-01-19 DIAGNOSIS — N3281 Overactive bladder: Secondary | ICD-10-CM | POA: Diagnosis not present

## 2021-01-19 DIAGNOSIS — N401 Enlarged prostate with lower urinary tract symptoms: Secondary | ICD-10-CM | POA: Diagnosis not present

## 2021-01-26 DIAGNOSIS — N3281 Overactive bladder: Secondary | ICD-10-CM | POA: Diagnosis not present

## 2021-01-26 DIAGNOSIS — N401 Enlarged prostate with lower urinary tract symptoms: Secondary | ICD-10-CM | POA: Diagnosis not present

## 2021-02-02 DIAGNOSIS — N3281 Overactive bladder: Secondary | ICD-10-CM | POA: Diagnosis not present

## 2021-02-02 DIAGNOSIS — N401 Enlarged prostate with lower urinary tract symptoms: Secondary | ICD-10-CM | POA: Diagnosis not present

## 2021-02-03 DIAGNOSIS — M5442 Lumbago with sciatica, left side: Secondary | ICD-10-CM | POA: Diagnosis not present

## 2021-02-03 DIAGNOSIS — G8929 Other chronic pain: Secondary | ICD-10-CM | POA: Diagnosis not present

## 2021-02-03 DIAGNOSIS — M5441 Lumbago with sciatica, right side: Secondary | ICD-10-CM | POA: Diagnosis not present

## 2021-02-03 DIAGNOSIS — M5126 Other intervertebral disc displacement, lumbar region: Secondary | ICD-10-CM | POA: Diagnosis not present

## 2021-02-10 DIAGNOSIS — M5442 Lumbago with sciatica, left side: Secondary | ICD-10-CM | POA: Diagnosis not present

## 2021-02-22 DIAGNOSIS — Z79891 Long term (current) use of opiate analgesic: Secondary | ICD-10-CM | POA: Diagnosis not present

## 2021-02-22 DIAGNOSIS — M47816 Spondylosis without myelopathy or radiculopathy, lumbar region: Secondary | ICD-10-CM | POA: Diagnosis not present

## 2021-02-22 DIAGNOSIS — M792 Neuralgia and neuritis, unspecified: Secondary | ICD-10-CM | POA: Diagnosis not present

## 2021-03-01 ENCOUNTER — Other Ambulatory Visit: Payer: Self-pay

## 2021-03-01 ENCOUNTER — Ambulatory Visit (INDEPENDENT_AMBULATORY_CARE_PROVIDER_SITE_OTHER): Payer: Medicare HMO | Admitting: Internal Medicine

## 2021-03-01 ENCOUNTER — Encounter: Payer: Self-pay | Admitting: Internal Medicine

## 2021-03-01 DIAGNOSIS — F17210 Nicotine dependence, cigarettes, uncomplicated: Secondary | ICD-10-CM | POA: Diagnosis not present

## 2021-03-01 DIAGNOSIS — R69 Illness, unspecified: Secondary | ICD-10-CM | POA: Diagnosis not present

## 2021-03-01 DIAGNOSIS — J449 Chronic obstructive pulmonary disease, unspecified: Secondary | ICD-10-CM

## 2021-03-01 NOTE — Assessment & Plan Note (Signed)
Stopped 09/2015 but still using e cigs as of 10/23/2019  - referred to [facility] via [facility]  10/23/2019 > did not go   Low-dose CT lung cancer screening is recommended for patients who are 69-66 years of age with a 20+ pack-year history of smoking, and who are currently smoking or quit <=15 years ago. No coughing up blood  No unintentional weight loss of > 15 pounds in the last 6 months  >>> eligible for 10 more years, referred again         Each maintenance medication was reviewed in detail including emphasizing most importantly the difference between maintenance and prns and under what circumstances the prns are to be triggered using an action plan format where appropriate.  Total time for H and P, chart review, counseling, reviewing hfa/neb device(s) and generating customized AVS unique to this office visit / same day charting = 30 min

## 2021-03-01 NOTE — Assessment & Plan Note (Signed)
Quit smoking 09/2015 PFT's April 28, 2010 FEV1 1.03 (33%) ratio 45 and no better p B2, DLC0 65% - Stopped working Apr 21 2015  - 08/31/2015   try add spiriva resp to sym 160  - 08/31/2015   Walked RA x one lap @ 185 stopped due to  Sob, nl pace, no desat  PFT's  09/02/15 FEV1 0.88(32%) ratio 43 p 15% improvement from saba p symbicort 160 / spiriva prior ith DLCO  38 corrects to 81 for alv volume   - 09/05/2015 completed disability paperwork  - 09/28/2015 completed additional disability paperwork  - 10/23/2019  After extensive coaching inhaler device,  effectiveness =    50% > change breo to breztri  - PFT's 11/25/19   FEV1 0.81 (31 % ) ratio 0.51  p 0 % improvement from saba p  "3 inhalers"   prior to study with DLCO  13.48 (56%) corrects to 4.35 (105%)  for alv volume and FV curve classic severe concavity   -  12/11/2019   Walked RA  approx   400 ft  @ moderat pace  stopped due to  End of study c/o sob with sats still    Feet with sats still 96%  - 01/21/2020  After extensive coaching inhaler device,  effectiveness =    90% p coaching with dpi > try trelegy x 2 weeks as still too saba dep on breztri > no better on trelegy so back on Breztri> can't afford either - 11/30/2020  After extensive coaching inhaler device,  effectiveness =    90% dpi > tri advair 250 bid(or generic version = wixella)   Unable to afford maint rx and just doing duoneb qid which is working just as well so far s flairs and turns out he is now more lmited by back than breathing so as long as not experiencing aecopd should do fine

## 2021-03-01 NOTE — Addendum Note (Signed)
Addended by: Fritzi Mandes D on: 03/01/2021 05:09 PM   Modules accepted: Orders

## 2021-03-01 NOTE — Progress Notes (Signed)
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 Age 65 ? Dx ? And reports  flunked pfts for job around 2000 and on disability and since then  dx as copd in [place] 2011 p seen in pulmonary clinic 04/28/10 and referred back by Dr Anthonette Legato 08/31/2015  With gold III cpd criteria in Jan 2012     History of Present Illness  April 28, 2010  1st pulmonary office eval ov cc doe and cough onset in 40's and progressed to point where doe shopping leans on cart and struggles to get groceries back to his house (lives next to store). has noct wheeze disturbs sleep.  can't tell if dulera helpiing at this point not really using it. Dx gold III/IV copd  rec dulera 100 2bid / prn saba  Stop smoking   08/31/2015 1st EPIC  [facility] office visit//  [name]  On symbicort 160 poor hfa  Chief Complaint  Patient presents with   Pulmonary Consult    Referred by Dr. Manuella Ghazi. Pt seen here in the past- last in 2012. He states that his breathing has been worse over the past few months. He states that he gets SOB walking "not far" and also when he gets upset about something.   wakes up coughing/ congested each am/bad days  can't walk across house s sob/other days does ok very slow to mailbox slt up hill back to house. Sob at rest when gets upset / some better with saba hfa and neb  rec Plan A = Automatic = Symbicort 160 Take 2 puffs first thing in am and then another 2 puffs about 12 hours later.  Spiriva 2 pffs each am (like high octane)  Plan B = Backup Only use your albuterol (proair) as a rescue medication  Plan C = Crisis - only use your albuterol nebulizer if you first try Plan B and it fails  Think of the e cigs as a one way bridge off all tobacco products      09/28/2015  f/u ov/[name] re: GOLDI III/IV copd/ still smoking rx  Symbicort/ spiriva respimat  02 prn   Chief Complaint  Patient presents with   Follow-up    Breathing is unchanged. No new co's today. He is using proair 1 x daily on average and neb 2 x daily on average.   MMRC3  = can't walk 100 yards even at a slow pace at a flat grade s stopping due to sob  rec  Plan A = Automatic = Symbicort 160 Take 2 puffs first thing in am and then another 2 puffs about 12 hours later.                                      Spiriva 2 pffs each am (like high octane fuel)  Plan B = Backup Only use your albuterol (proair) as a rescue medication   Plan C = Crisis - only use your albuterol nebulizer if you first try Plan B     10/23/2019  f/u ov/[name] re: GOLD III/ IV  -self referral / smoking e cigs and maint breo 100 / re-establish Chief Complaint  Patient presents with   Consult  Patient last seen in 2017. Patient has COPD and shortness of breath all the time exertion makes it worse. Has a cough but can't get up mucus.   Dyspnea:  mb is now a struggle/and back about 100 ft  Cough: some am cough /congestion > minimal mucoid   Sleeping: flat bed with pillows  SABA use: once a week / very rarely neb  02: none  rec I do recommned low dose Chest CT for the next 11 years  - contract [name] NP at the [place] office to arrange  Wean off vapes if possible  Plan A = Automatic = Always=   Breztri Take 2 puffs first thing in am and then another 2 puffs about 12 hours later.  Work on inhaler technique:  Plan B = Backup (to supplement plan A, not to replace it) Only use your albuterol (ventolin)  inhaler Plan C = Crisis (instead of Plan B but only if Plan B stops working) - only use your albuterol nebulizer if you first try Plan B and it fails to help > ok to use the nebulizer up to every 4 hours but if start needing it regularly call for immediate appointment Try albuterol 15 min before an activity that you know would make you short of breath and see if it makes any difference and if makes none then  don't take it after activity unless you can't catch your breath.    Please schedule a follow up office visit in 6-8  Weeks with PFTS with breztri x 2 puffs that am  = GOLD 3 p "3 inhalers"    12/11/2019  f/u ov/[name] office/[name] re: GOLD 3  Chief Complaint  Patient presents with   Follow-up    non-productive cough, shortness of breath with activity  Dyspnea:  No change mailbox and back, never pre challenges or rechallenges  Cough: dry cough Sleeping: poorly but not related to resp dz  SABA use: 2-3 x  02: None    01/21/2020  f/u ov/[place] office/[name] re: copd  GOLD III/ breztri/ still vapes Chief Complaint  Patient presents with   Follow-up    Breathing is about the same since the last visit. He is using his albuterol inhaler 3 x per day on average.   Dyspnea:  mb and back is easier Cough: none  Sleeping: no resp problem on flat bed x 3 pillows  SABA use: way too much  02: none  Rec Plan A = Automatic = Always=    Trelegy one click each am  Plan B = Backup (to supplement plan A, not to replace it) Only use your albuterol inhaler as a rescue medication  Plan C = Crisis (instead of Plan B but only if Plan B stops working) - only use your albuterol nebulizer if you first try Plan B and it fails to help > ok to use the nebulizer up to every 4 hours but if start needing it regularly call for immediate appointment Try albuterol 15 min before an activity that you know would make you short of breath and see if it makes any difference and if makes none then don't take it after activity unless you can't catch your breath Please schedule a follow up visit in 3 months but call sooner if needed     02/2020 COVID 19 infection    11/30/2020  f/u ov/[name] office/[name] re: GOLD III/ vapes stil /  maint on prn saba  as can't afford trelegy  Chief Complaint  Patient presents with   Follow-up    Breathing progressively worse since last visit. He continues to vape. Could not afford  trelegy.    Dyspnea:  better on Trelegy but can't affored  Cough: none  Sleeping: no resp problems flat bed with pillows  SABA use: bid albuterol 02: none  Covid status: vax x 3  Rec Plan A = Automatic = Always=    advair 250 one twice daily (Breo 100 one daily sample x 14 d)  Rinse and gargle after use  Plan B = Backup (to supplement plan A, not to replace it) Only use your albuterol inhaler as a rescue medication  Plan C = Crisis (instead of Plan B but only if Plan B stops working) - only use your albuterol nebulizer if you first try Plan B and it fails to help   03/01/2021  f/u ov/[place] office/Vrishank Moster re: GOLD 3 copd  maint on duoneb about 4 x daily   Chief Complaint  Patient presents with   Follow-up    Unable to afford inhaler prescribed last time breo and advair he states he is using the rescue inhaler constantly.    Dyspnea:  limited by back pain  Cough: min am congestion  Sleeping: 30 degrees with flat bed/ pillows  SABA use: qid  02: none  Covid status: vax x 3 Lung cancer screening: referred    No obvious day to day or daytime variability or assoc excess/ purulent sputum or mucus plugs or hemoptysis or cp or chest tightness, subjective wheeze or overt sinus or hb symptoms.   sleeping without nocturnal  or early am exacerbation  of respiratory  c/o's or need for noct saba. Also denies any obvious fluctuation of symptoms with weather or environmental changes or other aggravating or alleviating factors except as outlined above   No unusual exposure hx or h/o childhood pna/ asthma or knowledge of premature birth.  Current Allergies, Complete Past Medical History, Past Surgical History, Family History, and Social History were reviewed in [employer] record.  ROS  The following are not active complaints unless bolded Hoarseness, sore throat, dysphagia, dental problems, itching, sneezing,  nasal congestion or discharge of excess mucus or purulent  secretions, ear ache,   fever, chills, sweats, unintended wt loss or wt gain, classically pleuritic or exertional cp,  orthopnea pnd or arm/hand swelling  or leg swelling, presyncope, palpitations, abdominal pain, anorexia, nausea, vomiting, diarrhea  or change in bowel habits or change in bladder habits, change in stools or change in urine, dysuria, hematuria,  rash, arthralgias, visual complaints, headache, numbness, weakness or ataxia or problems with walking or coordination,  change in mood or  memory.        Current Meds  Medication Sig   albuterol (VENTOLIN HFA) 108 (90 Base) MCG/ACT inhaler Inhale 2 puffs into the lungs every 6 (six) hours as needed for wheezing or shortness of breath.   aspirin 81 MG tablet Take 81 mg by mouth daily.   clonazePAM (KLONOPIN) 0.5 MG tablet Take 0.5 mg by mouth 3 (three) times daily as needed for anxiety.   gabapentin (NEURONTIN) 300 MG capsule Take 1 capsule (300 mg total) by mouth 3 (three) times daily.   hydrochlorothiazide (HYDRODIURIL) 25 MG tablet Take 25 mg by mouth daily.   ipratropium-albuterol (DUONEB) 0.5-2.5 (3) MG/3ML SOLN Take 3 mLs by nebulization every 4 (four) hours as needed (Asthma).   NIFEdipine (PROCARDIA XL/NIFEDICAL-XL) 90 MG 24 hr tablet Take 90 mg by mouth  daily.   Omega-3 1000 MG CAPS Take 1,000 mg by mouth daily.   potassium chloride SA (K-DUR,KLOR-CON) 20 MEQ tablet Take 20 mEq by mouth daily.   simvastatin (ZOCOR) 20 MG tablet Take 20 mg by mouth daily.   terazosin (HYTRIN) 5 MG capsule Take 5 mg by mouth at bedtime.   traZODone (DESYREL) 150 MG tablet Take 150 mg by mouth at bedtime.   valsartan (DIOVAN) 320 MG tablet Take 320 mg by mouth daily.   venlafaxine XR (EFFEXOR-XR) 150 MG 24 hr capsule Take 150 mg by mouth daily with breakfast.              Objective:   Physical Exam   03/01/2021     218  11/30/2020       215  01/21/2020     218  12/11/2019       215  10/23/2019       215   09/28/15 198 lb (89.812 kg)   08/31/15 197 lb (89.359 kg)  04/28/10 200 lb 8 oz (90.946 kg)       Vital signs reviewed  03/01/2021  - Note at rest 02 sats  96% on RA   General appearance:    obese somber amb bm nad / walks with 4 pronged walker  HEENT : pt wearing mask not removed for exam due to covid -19 concerns.    NECK :  without JVD/Nodes/TM/ nl carotid upstrokes bilaterally   LUNGS: no acc muscle use,  Mod barrel  contour chest wall with bilateral  Distant bs s audible wheeze and  without cough on insp or exp maneuvers and mod  Hyperresonant  to  percussion bilaterally     CV:  RRR  no s3 or murmur or increase in P2, and no edema   ABD:  obese soft and nontender with pos mid insp Hoover's  in the supine position. No bruits or organomegaly appreciated, bowel sounds nl  MS:     ext warm without deformities, calf tenderness, cyanosis or clubbing No obvious joint restrictions   SKIN: warm and dry without lesions    NEURO:  alert, approp, nl sensorium with  no motor or cerebellar deficits apparent.        Assessment:

## 2021-03-01 NOTE — Patient Instructions (Addendum)
I will be referring you to our lung cancer screening program which can be done at [facility]  No change in your medications    Please schedule a follow up visit in 6 months but call sooner if needed

## 2021-03-22 DIAGNOSIS — Z299 Encounter for prophylactic measures, unspecified: Secondary | ICD-10-CM | POA: Diagnosis not present

## 2021-03-22 DIAGNOSIS — J449 Chronic obstructive pulmonary disease, unspecified: Secondary | ICD-10-CM | POA: Diagnosis not present

## 2021-03-22 DIAGNOSIS — N400 Enlarged prostate without lower urinary tract symptoms: Secondary | ICD-10-CM | POA: Diagnosis not present

## 2021-03-22 DIAGNOSIS — M545 Low back pain, unspecified: Secondary | ICD-10-CM | POA: Diagnosis not present

## 2021-03-22 DIAGNOSIS — R69 Illness, unspecified: Secondary | ICD-10-CM | POA: Diagnosis not present

## 2021-04-27 DIAGNOSIS — N3281 Overactive bladder: Secondary | ICD-10-CM | POA: Diagnosis not present

## 2021-04-27 DIAGNOSIS — F5221 Male erectile disorder: Secondary | ICD-10-CM | POA: Diagnosis not present

## 2021-04-27 DIAGNOSIS — N401 Enlarged prostate with lower urinary tract symptoms: Secondary | ICD-10-CM | POA: Diagnosis not present

## 2021-04-27 DIAGNOSIS — N528 Other male erectile dysfunction: Secondary | ICD-10-CM | POA: Insufficient documentation

## 2021-05-02 DIAGNOSIS — E782 Mixed hyperlipidemia: Secondary | ICD-10-CM | POA: Diagnosis not present

## 2021-05-02 DIAGNOSIS — I10 Essential (primary) hypertension: Secondary | ICD-10-CM | POA: Diagnosis not present

## 2021-06-02 DIAGNOSIS — Z299 Encounter for prophylactic measures, unspecified: Secondary | ICD-10-CM | POA: Diagnosis not present

## 2021-06-02 DIAGNOSIS — Z0000 Encounter for general adult medical examination without abnormal findings: Secondary | ICD-10-CM | POA: Diagnosis not present

## 2021-06-02 DIAGNOSIS — E7800 Pure hypercholesterolemia, unspecified: Secondary | ICD-10-CM | POA: Diagnosis not present

## 2021-06-02 DIAGNOSIS — Z1331 Encounter for screening for depression: Secondary | ICD-10-CM | POA: Diagnosis not present

## 2021-06-02 DIAGNOSIS — I10 Essential (primary) hypertension: Secondary | ICD-10-CM | POA: Diagnosis not present

## 2021-06-02 DIAGNOSIS — Z7189 Other specified counseling: Secondary | ICD-10-CM | POA: Diagnosis not present

## 2021-06-02 DIAGNOSIS — Z6835 Body mass index (BMI) 35.0-35.9, adult: Secondary | ICD-10-CM | POA: Diagnosis not present

## 2021-06-02 DIAGNOSIS — E6609 Other obesity due to excess calories: Secondary | ICD-10-CM | POA: Diagnosis not present

## 2021-06-02 DIAGNOSIS — Z1339 Encounter for screening examination for other mental health and behavioral disorders: Secondary | ICD-10-CM | POA: Diagnosis not present

## 2021-06-09 DIAGNOSIS — N401 Enlarged prostate with lower urinary tract symptoms: Secondary | ICD-10-CM | POA: Diagnosis not present

## 2021-06-09 DIAGNOSIS — N528 Other male erectile dysfunction: Secondary | ICD-10-CM | POA: Diagnosis not present

## 2021-06-09 DIAGNOSIS — N3281 Overactive bladder: Secondary | ICD-10-CM | POA: Diagnosis not present

## 2021-08-03 DIAGNOSIS — Z299 Encounter for prophylactic measures, unspecified: Secondary | ICD-10-CM | POA: Diagnosis not present

## 2021-08-03 DIAGNOSIS — I639 Cerebral infarction, unspecified: Secondary | ICD-10-CM | POA: Diagnosis not present

## 2021-08-03 DIAGNOSIS — J440 Chronic obstructive pulmonary disease with acute lower respiratory infection: Secondary | ICD-10-CM | POA: Diagnosis not present

## 2021-08-03 DIAGNOSIS — F17210 Nicotine dependence, cigarettes, uncomplicated: Secondary | ICD-10-CM | POA: Diagnosis not present

## 2021-08-03 DIAGNOSIS — M549 Dorsalgia, unspecified: Secondary | ICD-10-CM | POA: Diagnosis not present

## 2021-08-03 DIAGNOSIS — M5442 Lumbago with sciatica, left side: Secondary | ICD-10-CM | POA: Diagnosis not present

## 2021-08-08 DIAGNOSIS — M5416 Radiculopathy, lumbar region: Secondary | ICD-10-CM | POA: Diagnosis not present

## 2021-08-30 DIAGNOSIS — M48061 Spinal stenosis, lumbar region without neurogenic claudication: Secondary | ICD-10-CM | POA: Diagnosis not present

## 2021-08-30 DIAGNOSIS — M5136 Other intervertebral disc degeneration, lumbar region: Secondary | ICD-10-CM | POA: Diagnosis not present

## 2021-08-30 DIAGNOSIS — M4726 Other spondylosis with radiculopathy, lumbar region: Secondary | ICD-10-CM | POA: Diagnosis not present

## 2021-09-05 DIAGNOSIS — F17210 Nicotine dependence, cigarettes, uncomplicated: Secondary | ICD-10-CM | POA: Diagnosis not present

## 2021-09-05 DIAGNOSIS — I10 Essential (primary) hypertension: Secondary | ICD-10-CM | POA: Diagnosis not present

## 2021-09-05 DIAGNOSIS — Z299 Encounter for prophylactic measures, unspecified: Secondary | ICD-10-CM | POA: Diagnosis not present

## 2021-09-05 DIAGNOSIS — Z6835 Body mass index (BMI) 35.0-35.9, adult: Secondary | ICD-10-CM | POA: Diagnosis not present

## 2021-09-05 DIAGNOSIS — M549 Dorsalgia, unspecified: Secondary | ICD-10-CM | POA: Diagnosis not present

## 2021-09-14 DIAGNOSIS — N528 Other male erectile dysfunction: Secondary | ICD-10-CM | POA: Diagnosis not present

## 2021-09-14 DIAGNOSIS — N3281 Overactive bladder: Secondary | ICD-10-CM | POA: Diagnosis not present

## 2021-09-14 DIAGNOSIS — N401 Enlarged prostate with lower urinary tract symptoms: Secondary | ICD-10-CM | POA: Diagnosis not present

## 2021-09-15 DIAGNOSIS — M48061 Spinal stenosis, lumbar region without neurogenic claudication: Secondary | ICD-10-CM | POA: Diagnosis not present

## 2021-09-15 DIAGNOSIS — M5416 Radiculopathy, lumbar region: Secondary | ICD-10-CM | POA: Diagnosis not present

## 2021-09-15 DIAGNOSIS — F1120 Opioid dependence, uncomplicated: Secondary | ICD-10-CM | POA: Diagnosis not present

## 2021-09-15 DIAGNOSIS — M5136 Other intervertebral disc degeneration, lumbar region: Secondary | ICD-10-CM | POA: Diagnosis not present

## 2021-09-15 DIAGNOSIS — M4726 Other spondylosis with radiculopathy, lumbar region: Secondary | ICD-10-CM | POA: Diagnosis not present

## 2021-09-25 DIAGNOSIS — F1120 Opioid dependence, uncomplicated: Secondary | ICD-10-CM | POA: Insufficient documentation

## 2021-09-25 DIAGNOSIS — Z79891 Long term (current) use of opiate analgesic: Secondary | ICD-10-CM | POA: Insufficient documentation

## 2021-09-25 DIAGNOSIS — M5416 Radiculopathy, lumbar region: Secondary | ICD-10-CM | POA: Diagnosis not present

## 2021-09-25 DIAGNOSIS — M48061 Spinal stenosis, lumbar region without neurogenic claudication: Secondary | ICD-10-CM | POA: Insufficient documentation

## 2021-09-30 DIAGNOSIS — F330 Major depressive disorder, recurrent, mild: Secondary | ICD-10-CM | POA: Diagnosis not present

## 2021-10-06 DIAGNOSIS — Z0000 Encounter for general adult medical examination without abnormal findings: Secondary | ICD-10-CM | POA: Diagnosis not present

## 2021-10-06 DIAGNOSIS — M549 Dorsalgia, unspecified: Secondary | ICD-10-CM | POA: Diagnosis not present

## 2021-10-06 DIAGNOSIS — Z299 Encounter for prophylactic measures, unspecified: Secondary | ICD-10-CM | POA: Diagnosis not present

## 2021-10-06 DIAGNOSIS — Z6834 Body mass index (BMI) 34.0-34.9, adult: Secondary | ICD-10-CM | POA: Diagnosis not present

## 2021-10-06 DIAGNOSIS — I10 Essential (primary) hypertension: Secondary | ICD-10-CM | POA: Diagnosis not present

## 2021-10-11 DIAGNOSIS — M961 Postlaminectomy syndrome, not elsewhere classified: Secondary | ICD-10-CM | POA: Insufficient documentation

## 2021-10-20 DIAGNOSIS — M9972 Connective tissue and disc stenosis of intervertebral foramina of thoracic region: Secondary | ICD-10-CM | POA: Diagnosis not present

## 2021-10-20 DIAGNOSIS — M9973 Connective tissue and disc stenosis of intervertebral foramina of lumbar region: Secondary | ICD-10-CM | POA: Diagnosis not present

## 2021-10-20 DIAGNOSIS — M545 Low back pain, unspecified: Secondary | ICD-10-CM | POA: Diagnosis not present

## 2021-10-20 DIAGNOSIS — Z9889 Other specified postprocedural states: Secondary | ICD-10-CM | POA: Diagnosis not present

## 2021-10-20 DIAGNOSIS — M48061 Spinal stenosis, lumbar region without neurogenic claudication: Secondary | ICD-10-CM | POA: Diagnosis not present

## 2021-10-20 DIAGNOSIS — M5136 Other intervertebral disc degeneration, lumbar region: Secondary | ICD-10-CM | POA: Diagnosis not present

## 2021-10-20 DIAGNOSIS — M549 Dorsalgia, unspecified: Secondary | ICD-10-CM | POA: Diagnosis not present

## 2021-10-20 DIAGNOSIS — M4804 Spinal stenosis, thoracic region: Secondary | ICD-10-CM | POA: Diagnosis not present

## 2021-10-20 DIAGNOSIS — M4726 Other spondylosis with radiculopathy, lumbar region: Secondary | ICD-10-CM | POA: Diagnosis not present

## 2021-10-20 DIAGNOSIS — M5124 Other intervertebral disc displacement, thoracic region: Secondary | ICD-10-CM | POA: Diagnosis not present

## 2021-10-25 DIAGNOSIS — M25551 Pain in right hip: Secondary | ICD-10-CM | POA: Diagnosis not present

## 2021-10-25 DIAGNOSIS — M7061 Trochanteric bursitis, right hip: Secondary | ICD-10-CM | POA: Diagnosis not present

## 2021-10-25 DIAGNOSIS — M5136 Other intervertebral disc degeneration, lumbar region: Secondary | ICD-10-CM | POA: Insufficient documentation

## 2021-11-06 DIAGNOSIS — N529 Male erectile dysfunction, unspecified: Secondary | ICD-10-CM | POA: Diagnosis not present

## 2021-11-06 DIAGNOSIS — R319 Hematuria, unspecified: Secondary | ICD-10-CM | POA: Diagnosis not present

## 2021-11-06 DIAGNOSIS — N35819 Other urethral stricture, male, unspecified site: Secondary | ICD-10-CM | POA: Diagnosis not present

## 2021-11-17 DIAGNOSIS — M4726 Other spondylosis with radiculopathy, lumbar region: Secondary | ICD-10-CM | POA: Diagnosis not present

## 2021-11-17 DIAGNOSIS — M48061 Spinal stenosis, lumbar region without neurogenic claudication: Secondary | ICD-10-CM | POA: Diagnosis not present

## 2021-11-17 DIAGNOSIS — Z01812 Encounter for preprocedural laboratory examination: Secondary | ICD-10-CM | POA: Diagnosis not present

## 2021-11-17 DIAGNOSIS — M5136 Other intervertebral disc degeneration, lumbar region: Secondary | ICD-10-CM | POA: Diagnosis not present

## 2021-11-17 DIAGNOSIS — Z01810 Encounter for preprocedural cardiovascular examination: Secondary | ICD-10-CM | POA: Diagnosis not present

## 2021-11-23 DIAGNOSIS — R7989 Other specified abnormal findings of blood chemistry: Secondary | ICD-10-CM | POA: Insufficient documentation

## 2021-11-23 DIAGNOSIS — N528 Other male erectile dysfunction: Secondary | ICD-10-CM | POA: Diagnosis not present

## 2021-11-23 DIAGNOSIS — R399 Unspecified symptoms and signs involving the genitourinary system: Secondary | ICD-10-CM | POA: Insufficient documentation

## 2021-11-23 DIAGNOSIS — M6289 Other specified disorders of muscle: Secondary | ICD-10-CM | POA: Diagnosis not present

## 2021-11-23 DIAGNOSIS — Z6834 Body mass index (BMI) 34.0-34.9, adult: Secondary | ICD-10-CM | POA: Diagnosis not present

## 2021-11-23 DIAGNOSIS — E6609 Other obesity due to excess calories: Secondary | ICD-10-CM | POA: Diagnosis not present

## 2021-11-24 DIAGNOSIS — M5116 Intervertebral disc disorders with radiculopathy, lumbar region: Secondary | ICD-10-CM | POA: Diagnosis not present

## 2021-11-24 DIAGNOSIS — G894 Chronic pain syndrome: Secondary | ICD-10-CM | POA: Diagnosis not present

## 2021-11-24 DIAGNOSIS — F1190 Opioid use, unspecified, uncomplicated: Secondary | ICD-10-CM | POA: Diagnosis not present

## 2021-11-24 DIAGNOSIS — J449 Chronic obstructive pulmonary disease, unspecified: Secondary | ICD-10-CM | POA: Diagnosis not present

## 2021-11-24 DIAGNOSIS — M4726 Other spondylosis with radiculopathy, lumbar region: Secondary | ICD-10-CM | POA: Diagnosis not present

## 2021-11-24 DIAGNOSIS — E785 Hyperlipidemia, unspecified: Secondary | ICD-10-CM | POA: Diagnosis not present

## 2021-11-24 DIAGNOSIS — I10 Essential (primary) hypertension: Secondary | ICD-10-CM | POA: Diagnosis not present

## 2021-11-24 DIAGNOSIS — M1990 Unspecified osteoarthritis, unspecified site: Secondary | ICD-10-CM | POA: Diagnosis not present

## 2021-11-24 DIAGNOSIS — M961 Postlaminectomy syndrome, not elsewhere classified: Secondary | ICD-10-CM | POA: Diagnosis not present

## 2021-11-24 DIAGNOSIS — F17290 Nicotine dependence, other tobacco product, uncomplicated: Secondary | ICD-10-CM | POA: Diagnosis not present

## 2021-11-29 DIAGNOSIS — M792 Neuralgia and neuritis, unspecified: Secondary | ICD-10-CM | POA: Insufficient documentation

## 2021-12-08 DIAGNOSIS — R399 Unspecified symptoms and signs involving the genitourinary system: Secondary | ICD-10-CM | POA: Diagnosis not present

## 2021-12-29 DIAGNOSIS — E669 Obesity, unspecified: Secondary | ICD-10-CM | POA: Diagnosis not present

## 2021-12-29 DIAGNOSIS — Z87891 Personal history of nicotine dependence: Secondary | ICD-10-CM | POA: Diagnosis not present

## 2021-12-29 DIAGNOSIS — J449 Chronic obstructive pulmonary disease, unspecified: Secondary | ICD-10-CM | POA: Diagnosis not present

## 2021-12-29 DIAGNOSIS — E785 Hyperlipidemia, unspecified: Secondary | ICD-10-CM | POA: Diagnosis not present

## 2021-12-29 DIAGNOSIS — Z7982 Long term (current) use of aspirin: Secondary | ICD-10-CM | POA: Diagnosis not present

## 2021-12-29 DIAGNOSIS — I10 Essential (primary) hypertension: Secondary | ICD-10-CM | POA: Diagnosis not present

## 2021-12-29 DIAGNOSIS — M961 Postlaminectomy syndrome, not elsewhere classified: Secondary | ICD-10-CM | POA: Diagnosis not present

## 2021-12-29 DIAGNOSIS — Z6835 Body mass index (BMI) 35.0-35.9, adult: Secondary | ICD-10-CM | POA: Diagnosis not present

## 2021-12-29 DIAGNOSIS — G4730 Sleep apnea, unspecified: Secondary | ICD-10-CM | POA: Diagnosis not present

## 2021-12-29 DIAGNOSIS — Z79899 Other long term (current) drug therapy: Secondary | ICD-10-CM | POA: Diagnosis not present

## 2022-01-09 DIAGNOSIS — M5416 Radiculopathy, lumbar region: Secondary | ICD-10-CM | POA: Diagnosis not present

## 2022-01-09 DIAGNOSIS — M961 Postlaminectomy syndrome, not elsewhere classified: Secondary | ICD-10-CM | POA: Diagnosis not present

## 2022-01-19 ENCOUNTER — Telehealth: Payer: Self-pay | Admitting: Internal Medicine

## 2022-01-19 NOTE — Telephone Encounter (Signed)
I called the patient and offered an appointment with one of the nurse practitioners in the next week or two.  Sean Ramos declined and is willing to wait to see Dr. Melvyn Novas only. Sean Ramos feels Sean Ramos will explain it better.   Sean Ramos is aware to seek emergency care if Sean Ramos is worse and Sean Ramos voices understanding. Please call to make him a follow up

## 2022-02-06 DIAGNOSIS — I10 Essential (primary) hypertension: Secondary | ICD-10-CM | POA: Diagnosis not present

## 2022-02-06 DIAGNOSIS — Z6834 Body mass index (BMI) 34.0-34.9, adult: Secondary | ICD-10-CM | POA: Diagnosis not present

## 2022-02-06 DIAGNOSIS — M549 Dorsalgia, unspecified: Secondary | ICD-10-CM | POA: Diagnosis not present

## 2022-02-06 DIAGNOSIS — J209 Acute bronchitis, unspecified: Secondary | ICD-10-CM | POA: Diagnosis not present

## 2022-02-06 DIAGNOSIS — J440 Chronic obstructive pulmonary disease with acute lower respiratory infection: Secondary | ICD-10-CM | POA: Diagnosis not present

## 2022-02-12 DIAGNOSIS — N528 Other male erectile dysfunction: Secondary | ICD-10-CM | POA: Diagnosis not present

## 2022-02-12 DIAGNOSIS — E291 Testicular hypofunction: Secondary | ICD-10-CM | POA: Diagnosis not present

## 2022-02-20 ENCOUNTER — Encounter: Payer: Self-pay | Admitting: Internal Medicine

## 2022-02-20 ENCOUNTER — Ambulatory Visit (INDEPENDENT_AMBULATORY_CARE_PROVIDER_SITE_OTHER): Payer: Medicare HMO | Admitting: Internal Medicine

## 2022-02-20 VITALS — BP 142/82 | HR 92 | Temp 97.7°F | Ht 66.5 in | Wt 220.2 lb

## 2022-02-20 DIAGNOSIS — M961 Postlaminectomy syndrome, not elsewhere classified: Secondary | ICD-10-CM | POA: Diagnosis not present

## 2022-02-20 DIAGNOSIS — J449 Chronic obstructive pulmonary disease, unspecified: Secondary | ICD-10-CM | POA: Diagnosis not present

## 2022-02-20 DIAGNOSIS — F17210 Nicotine dependence, cigarettes, uncomplicated: Secondary | ICD-10-CM

## 2022-02-20 MED ORDER — TRELEGY ELLIPTA 100-62.5-25 MCG/ACT IN AEPB: 1.0000 | INHALATION_SPRAY | Freq: Every day | RESPIRATORY_TRACT | 0 refills | Status: DC

## 2022-02-20 NOTE — Patient Instructions (Addendum)
Plan A = Automatic = Always=    Trelegy 680 one click each am take 2 good drags  Plan B = Backup (to supplement plan A, not to replace it) Only use your albuterol inhaler as a rescue medication to be used if you can't catch your breath by resting or doing a relaxed purse lip breathing pattern.  - The less you use it, the better it will work when you need it. - Ok to use the inhaler up to 2 puffs  every 4 hours if you must but call for appointment if use goes up over your usual need - Don't leave home without it !!  (think of it like the spare tire for your car)   Plan C = Crisis (instead of Plan B but only if Plan B stops working) - only use your albuterol nebulizer if you first try Plan B and it fails to help > ok to use the nebulizer up to every 4 hours but if start needing it regularly call for immediate appointment   Please schedule a follow up office visit in 6 weeks, call sooner if needed with all medications /inhalers/ solutions in hand so we can verify exactly what you are taking. This includes all medications from all doctors and over the counters   Add referred again for [facility]

## 2022-02-20 NOTE — Progress Notes (Unsigned)
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 Age 66 ? Dx ? And reports  flunked pfts for job around 2000 and on disability and since then  dx as copd in [place] 2011 p seen in pulmonary clinic 04/28/10 and referred back by Dr Anthonette Legato 08/31/2015  With gold III cpd criteria in Jan 2012     History of Present Illness  April 28, 2010  1st pulmonary office eval ov cc doe and cough onset in 66 and progressed to point where doe shopping leans on cart and struggles to get groceries back to his house (lives next to store). has noct wheeze disturbs sleep.  can't tell if dulera helpiing at this point not really using it. Dx gold III/IV copd  rec dulera 100 2bid / prn saba  Stop smoking   08/31/2015 1st EPIC  [facility] office visit//  Sean Ramos  On symbicort 160 poor hfa  Chief Complaint  Patient presents with   Pulmonary Consult    Referred by Dr. Manuella Ghazi. Pt seen here in the past- last in 2012. He states that his breathing has been worse over the past few months. He states that he gets SOB walking "not far" and also when he gets upset about something.   wakes up coughing/ congested each am/bad days  can't walk across house s sob/other days does ok very slow to mailbox slt up hill back to house. Sob at rest when gets upset / some better with saba hfa and neb  rec Plan A = Automatic = Symbicort 160 Take 2 puffs first thing in am and then another 2 puffs about 12 hours later.  Spiriva 2 pffs each am (like high octane)  Plan B = Backup Only use your albuterol (proair) as a rescue medication  Plan C = Crisis - only use your albuterol nebulizer if you first try Plan B and it fails  Think of the e cigs as a one way bridge off all tobacco products     10/23/2019  f/u ov/Sean Ramos re: GOLD III/ IV  -self referral / smoking e cigs and maint breo 100 /  re-establish Chief Complaint  Patient presents with   Consult    Patient last seen in 2017. Patient has COPD and shortness of breath all the time exertion makes it worse. Has a cough but can't get up mucus.   Dyspnea:  mb is now a struggle/and back about 100 ft  Cough: some am cough /congestion > minimal mucoid   Sleeping: flat bed with pillows  SABA use: once a week / very rarely neb  02: none  rec I do recommned low dose Chest CT for the next 11 years  - contract [name] NP at the [place] office to arrange  Wean off vapes if possible  Plan A = Automatic = Always=   Breztri Take 2 puffs first thing in am and then another 2 puffs about 12 hours later.  Work on inhaler technique:  Plan B = Backup (to supplement plan A, not to replace it) Only use your albuterol (ventolin)  inhaler Plan C = Crisis (instead of Plan B but only if Plan B stops working) - only use your albuterol nebulizer if you first try Plan B and it fails to help > ok  to use the nebulizer up to every 4 hours but if start needing it regularly call for immediate appointment Try albuterol 15 min before an activity that you know would make you short of breath and see if it makes any difference and if makes none then don't take it after activity unless you can't catch your breath.  Please schedule a follow up office visit in 6-8  Weeks with PFTS with breztri x 2 puffs that am  = GOLD 3 p "3 inhalers"     02/2020 COVID 19 infection    11/30/2020  f/u ov/[place] office/Sean Ramos re: GOLD III/ vapes stil /  maint on prn saba  as can't afford trelegy  Chief Complaint  Patient presents with   Follow-up    Breathing progressively worse since last visit. He continues to vape. Could not afford trelegy.    Dyspnea:  better on Trelegy but can't affored  Cough: none  Sleeping: no resp problems flat bed with pillows  SABA use: bid albuterol 02: none  Covid status: vax x 3  Rec Plan A = Automatic = Always=    advair 250 one twice  daily (Breo 100 one daily sample x 14 d)  Rinse and gargle after use  Plan B = Backup (to supplement plan A, not to replace it) Only use your albuterol inhaler as a rescue medication  Plan C = Crisis (instead of Plan B but only if Plan B stops working) - only use your albuterol nebulizer if you first try Plan B and it fails to help   03/01/2021  f/u ov/[place] office/Sean Ramos re: GOLD 3 copd  maint on duoneb about 4 x daily   Chief Complaint  Patient presents with   Follow-up    Unable to afford inhaler prescribed last time breo and advair he states he is using the rescue inhaler constantly.    Dyspnea:  limited by back pain  Cough: min am congestion  Sleeping: 30 degrees with flat bed/ pillows  SABA use: qid  02: none  Covid status: vax x 3 Lung cancer screening: referred  Rec I will be referring you to our lung cancer screening program which can be done at [facility] No change in your medications     02/20/2022  f/u ov/[place] office/Sean Ramos re: GOLD 3 copd maint on advair 250  Chief Complaint  Patient presents with   Follow-up    Having issues with breathing   Dyspnea:  very little activity  Cough: none  Sleeping: on right side down/ flat bed lots of pillow  SABA use: too much, never prechallenges  02: has POC doesn't know how  to use      No obvious day to day or daytime variability or assoc excess/ purulent sputum or mucus plugs or hemoptysis or cp or chest tightness, subjective wheeze or overt sinus or hb symptoms.   *** without nocturnal  or early am exacerbation  of respiratory  c/o's or need for noct saba. Also denies any obvious fluctuation of symptoms with weather or environmental changes or other aggravating or alleviating factors except as outlined above   No unusual exposure hx or h/o childhood pna/ asthma or knowledge of premature birth.  Current Allergies, Complete Past Medical History, Past Surgical History, Family History, and Social History  were reviewed in [employer] record.  ROS  The following are not active complaints unless bolded Hoarseness, sore throat, dysphagia, dental problems, itching, sneezing,  nasal congestion or discharge of excess  mucus or purulent secretions, ear ache,   fever, chills, sweats, unintended wt loss or wt gain, classically pleuritic or exertional cp,  orthopnea pnd or arm/hand swelling  or leg swelling, presyncope, palpitations, abdominal pain, anorexia, nausea, vomiting, diarrhea  or change in bowel habits or change in bladder habits, change in stools or change in urine, dysuria, hematuria,  rash, arthralgias, visual complaints, headache, numbness, weakness or ataxia or problems with walking or coordination,  change in mood or  memory.        Current Meds  Medication Sig   albuterol (VENTOLIN HFA) 108 (90 Base) MCG/ACT inhaler Inhale 2 puffs into the lungs every 6 (six) hours as needed for wheezing or shortness of breath.   aspirin 81 MG tablet Take 81 mg by mouth daily.   clonazePAM (KLONOPIN) 0.5 MG tablet Take 0.5 mg by mouth 3 (three) times daily as needed for anxiety.   fluticasone furoate-vilanterol (BREO ELLIPTA) 100-25 MCG/INH AEPB Inhale 1 puff into the lungs daily.   fluticasone-salmeterol (ADVAIR) 250-50 MCG/ACT AEPB One twice daily   gabapentin (NEURONTIN) 300 MG capsule Take 1 capsule (300 mg total) by mouth 3 (three) times daily.   hydrochlorothiazide (HYDRODIURIL) 25 MG tablet Take 25 mg by mouth daily.   ipratropium-albuterol (DUONEB) 0.5-2.5 (3) MG/3ML SOLN Take 3 mLs by nebulization every 4 (four) hours as needed (Asthma).   NIFEdipine (PROCARDIA XL/NIFEDICAL-XL) 90 MG 24 hr tablet Take 90 mg by mouth daily.   Omega-3 1000 MG CAPS Take 1,000 mg by mouth daily.   potassium chloride SA (K-DUR,KLOR-CON) 20 MEQ tablet Take 20 mEq by mouth daily.   simvastatin (ZOCOR) 20 MG tablet Take 20 mg by mouth daily.   terazosin (HYTRIN) 5 MG capsule Take 5 mg by mouth at  bedtime.   traZODone (DESYREL) 150 MG tablet Take 150 mg by mouth at bedtime.   valsartan (DIOVAN) 320 MG tablet Take 320 mg by mouth daily.   venlafaxine XR (EFFEXOR-XR) 150 MG 24 hr capsule Take 150 mg by mouth daily with breakfast.                Objective:   Physical Exam   Wts  02/20/2022      220  03/01/2021     218  11/30/2020       215  01/21/2020     218  12/11/2019       215  10/23/2019       215   09/28/15 198 lb (89.812 kg)  08/31/15 197 lb (89.359 kg)  04/28/10 200 lb 8 oz (90.946 kg)    Vital signs reviewed  02/20/2022  - Note at rest 02 sats  94% on RA   General appearance:    amb bm very talkative / nad at rest / pseudowheeze resolves with plm       Mod barrel ***       Assessment:

## 2022-02-21 ENCOUNTER — Encounter: Payer: Self-pay | Admitting: Internal Medicine

## 2022-02-21 NOTE — Assessment & Plan Note (Signed)
Quit smoking 09/2015 PFT's April 28, 2010  FEV1 1.03 (33%) ratio 45 and no better p B2,  DLC0 65% - Stopped working Apr 21 2015  - 08/31/2015   try add spiriva resp to sym 160  - 08/31/2015   Walked RA x one lap @ 185 stopped due to  Sob, nl pace, no desat  PFT's  09/02/15 FEV1 0.88(32%) ratio 43 p 15% improvement from saba p symbicort 160 / spiriva prior ith DLCO  38 corrects to 81 for alv volume   - 09/05/2015 completed disability paperwork  - 09/28/2015 completed additional disability paperwork  - 10/23/2019  After extensive coaching inhaler device,  effectiveness =    50% > change breo to breztri  - PFT's 11/25/19   FEV1 0.81 (31 % ) ratio 0.51  p 0 % improvement from saba p  "3 inhalers"   prior to study with DLCO  13.48 (56%) corrects to 4.35 (105%)  for alv volume and FV curve classic severe concavity   -  12/11/2019   Walked RA  approx   400 ft  @ moderat pace  stopped due to  End of study c/o sob with sats still    Feet with sats still 96%  - 01/21/2020  After extensive coaching inhaler device,  effectiveness =    90% p coaching with dpi > try trelegy x 2 weeks as still too saba dep on breztri > no better on trelegy so back on Breztri> can't afford either - 11/30/2020  After extensive coaching inhaler device,  effectiveness =    90% dpi > tri advair 250 bid(or generic version = wixella)  - 02/20/2022   Walked on RA  x  2  lap(s) =  approx 300 ft  @ slow pace, stopped due to sob/back pain  with lowest 02 sats 93%   - 02/20/2022  After extensive coaching inhaler device,  effectiveness =    90% with elipta dpi   Group D (now reclassified as E) in terms of symptom/risk and laba/lama/ICS  therefore appropriate rx at this point >>>  trelegy and approp saba  Re SABA :  I spent extra time with pt today reviewing appropriate use of albuterol for prn use on exertion with the following points: 1) saba is for relief of sob that does not improve by walking a slower pace or resting but rather if the pt does not  improve after trying this first. 2) If the pt is convinced, as many are, that saba helps recover from activity faster then it's easy to tell if this is the case by re-challenging : ie stop, take the inhaler, then p 5 minutes try the exact same activity (intensity of workload) that just caused the symptoms and see if they are substantially diminished or not after saba 3) if there is an activity that reproducibly causes the symptoms, try the saba 15 min before the activity on alternate days   If in fact the saba really does help, then fine to continue to use it prn but advised may need to look closer at the maintenance regimen being used to achieve better control of airways disease with exertion.   Advised:  formulary restrictions will be an ongoing challenge for the forseable future and I would be happy to pick an alternative if the pt will first  provide me a list of them -  pt  will need to return here for training for any new device that is required eg dpi  vs hfa vs respimat.    In the meantime we can always provide samples so that the patient never runs out of any needed respiratory medications.    F/u in 6 weeks with all meds in hand using a trust but verify approach to confirm accurate Medication  Reconciliation The principal here is that until we are certain that the  patients are doing what we've asked, it makes no sense to ask them to do more.   Referred again for LDSCT   Each maintenance medication was reviewed in detail including emphasizing most importantly the difference between maintenance and prns and under what circumstances the prns are to be triggered using an action plan format where appropriate.  Total time for H and P, chart review, counseling, reviewing hfa/dpi/neb device(s) , directly observing portions of ambulatory 02 saturation study/ and generating customized AVS unique to this office visit / same day charting = 32 min

## 2022-03-06 DIAGNOSIS — R399 Unspecified symptoms and signs involving the genitourinary system: Secondary | ICD-10-CM | POA: Diagnosis not present

## 2022-03-06 DIAGNOSIS — R3989 Other symptoms and signs involving the genitourinary system: Secondary | ICD-10-CM | POA: Diagnosis not present

## 2022-03-19 DIAGNOSIS — J069 Acute upper respiratory infection, unspecified: Secondary | ICD-10-CM | POA: Diagnosis not present

## 2022-03-19 DIAGNOSIS — R0981 Nasal congestion: Secondary | ICD-10-CM | POA: Diagnosis not present

## 2022-03-23 DIAGNOSIS — Z6834 Body mass index (BMI) 34.0-34.9, adult: Secondary | ICD-10-CM | POA: Diagnosis not present

## 2022-03-23 DIAGNOSIS — E6609 Other obesity due to excess calories: Secondary | ICD-10-CM | POA: Diagnosis not present

## 2022-03-23 DIAGNOSIS — N529 Male erectile dysfunction, unspecified: Secondary | ICD-10-CM | POA: Diagnosis not present

## 2022-03-23 DIAGNOSIS — R7989 Other specified abnormal findings of blood chemistry: Secondary | ICD-10-CM | POA: Diagnosis not present

## 2022-03-23 DIAGNOSIS — R3916 Straining to void: Secondary | ICD-10-CM | POA: Diagnosis not present

## 2022-03-23 DIAGNOSIS — R3914 Feeling of incomplete bladder emptying: Secondary | ICD-10-CM | POA: Diagnosis not present

## 2022-03-23 DIAGNOSIS — N3941 Urge incontinence: Secondary | ICD-10-CM | POA: Diagnosis not present

## 2022-03-23 DIAGNOSIS — R351 Nocturia: Secondary | ICD-10-CM | POA: Diagnosis not present

## 2022-03-29 DIAGNOSIS — M549 Dorsalgia, unspecified: Secondary | ICD-10-CM | POA: Diagnosis not present

## 2022-03-29 DIAGNOSIS — R609 Edema, unspecified: Secondary | ICD-10-CM | POA: Diagnosis not present

## 2022-03-29 DIAGNOSIS — Z299 Encounter for prophylactic measures, unspecified: Secondary | ICD-10-CM | POA: Diagnosis not present

## 2022-03-29 DIAGNOSIS — F17210 Nicotine dependence, cigarettes, uncomplicated: Secondary | ICD-10-CM | POA: Diagnosis not present

## 2022-03-29 DIAGNOSIS — R7989 Other specified abnormal findings of blood chemistry: Secondary | ICD-10-CM | POA: Diagnosis not present

## 2022-03-29 DIAGNOSIS — R0602 Shortness of breath: Secondary | ICD-10-CM | POA: Diagnosis not present

## 2022-03-29 DIAGNOSIS — I10 Essential (primary) hypertension: Secondary | ICD-10-CM | POA: Diagnosis not present

## 2022-03-29 DIAGNOSIS — R5383 Other fatigue: Secondary | ICD-10-CM | POA: Diagnosis not present

## 2022-03-29 DIAGNOSIS — Z6836 Body mass index (BMI) 36.0-36.9, adult: Secondary | ICD-10-CM | POA: Diagnosis not present

## 2022-04-04 ENCOUNTER — Encounter: Payer: Self-pay | Admitting: Internal Medicine

## 2022-04-04 ENCOUNTER — Ambulatory Visit (INDEPENDENT_AMBULATORY_CARE_PROVIDER_SITE_OTHER): Payer: Medicare HMO | Admitting: Internal Medicine

## 2022-04-04 VITALS — BP 134/80 | HR 104 | Temp 97.7°F | Ht 66.5 in | Wt 218.2 lb

## 2022-04-04 DIAGNOSIS — R69 Illness, unspecified: Secondary | ICD-10-CM | POA: Diagnosis not present

## 2022-04-04 DIAGNOSIS — F17210 Nicotine dependence, cigarettes, uncomplicated: Secondary | ICD-10-CM | POA: Diagnosis not present

## 2022-04-04 DIAGNOSIS — J449 Chronic obstructive pulmonary disease, unspecified: Secondary | ICD-10-CM | POA: Diagnosis not present

## 2022-04-04 NOTE — Patient Instructions (Addendum)
My office will be contacting you by phone for referral to lung cancer screening program  - if you don't hear back from my office within one week please call us back or notify us thru MyChart and we'll address it right away. (They will be calling from a 522 number in [facility] but just 2 puffs in am and the other 2 pufs 12 h later  are optional   Only use your albuterol as a rescue medication to be used if you can't catch your breath by resting or doing a relaxed purse lip breathing pattern.  - The less you use it, the better it will work when you need it. - Ok to use up to 2 puffs  every 4 hours if you must but call for immediate appointment if use goes up over your usual need - Don't leave home without it !!  (think of it like the spare tire for your car)   Also  Ok to try albuterol 15 min before an activity (on alternating days)  that you know would usually make you short of breath and see if it makes any difference and if makes none then don't take albuterol after activity unless you can't catch your breath as this means it's the resting that helps, not the albuterol.      Please schedule a follow up visit in 3 months but call sooner if needed

## 2022-04-04 NOTE — Progress Notes (Signed)
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 Age 67 ? Dx ? And reports  flunked pfts for job around 2000 and on disability and since then  dx as copd in [place] 2011 p seen in pulmonary clinic 04/28/10 and referred back by Dr Anthonette Legato 08/31/2015  With gold III cpd criteria in Jan 2012     History of Present Illness  April 28, 2010  1st pulmonary office eval ov cc doe and cough onset in 40's and progressed to point where doe shopping leans on cart and struggles to get groceries back to his house (lives next to store). has noct wheeze disturbs sleep.  can't tell if dulera helpiing at this point not really using it. Dx gold III/IV copd  rec dulera 100 2bid / prn saba  Stop smoking   08/31/2015 1st EPIC  [place] [facility] office visit//  Sean Ramos  On symbicort 160 poor hfa  Chief Complaint  Patient presents with   Pulmonary Consult    Referred by Dr. Manuella Ghazi. Pt seen here in the past- last in 2012. He states that his breathing has been worse over the past few months. He states that he gets SOB walking "not far" and also when he gets upset about something.   wakes up coughing/ congested each am/bad days  can't walk across house s sob/other days does ok very slow to mailbox slt up hill back to house. Sob at rest when gets upset / some better with saba hfa and neb  rec Plan A = Automatic = Symbicort 160 Take 2 puffs first thing in am and then another 2 puffs about 12 hours later.  Spiriva 2 pffs each am (like high octane)  Plan B = Backup Only use your albuterol (proair) as a rescue medication  Plan C = Crisis - only use your albuterol nebulizer if you first try Plan B and it fails  Think of the e cigs as a one way bridge off all tobacco products     02/2020 COVID 19 infection      03/01/2021  f/u ov/[name]/Sean Ramos re: GOLD 3 copd  maint  on duoneb about 4 x daily   Chief Complaint  Patient presents with   Follow-up    Unable to afford inhaler prescribed last time breo and advair he states he is using the rescue inhaler constantly.    Dyspnea:  limited by back pain  Cough: min am congestion  Sleeping: 30 degrees with flat bed/ pillows  SABA use: qid  02: none  Covid status: vax x 3 Lung cancer screening: referred  Rec I will be referring you to our lung cancer screening program which can be done at [facility] No change in your medications     02/20/2022  f/u ov/[place] office/Sean Ramos re: GOLD 3 copd maint on advair 250  Chief Complaint  Patient presents with   Follow-up    Having issues with breathing   Dyspnea:  very little activity / unsure of meds Cough: none  Sleeping: on right side down/ flat bed lots of pillow  SABA use: too much, never prechallenges  02: has POC doesn't know how  to use  Rec Plan A = Automatic = Always=    Trelegy 297 one click each am take 2 good  drags Plan B = Backup (to supplement plan A, not to replace it) Only use your albuterol inhaler as a rescue medication  Plan C = Crisis (instead of Plan B but only if Plan B stops working) - only use your albuterol nebulizer if you first try Plan B  Please schedule a follow up office visit in 6 weeks, call sooner if needed with all medications /inhalers/ solutions in hand  Add referred again for LDSCT    04/04/2022  f/u ov/[place] office/Sean Ramos re: GOLD 3  maint on breztri   Chief Complaint  Patient presents with   Follow-up    Feels breathing is worse than last ov.  Declined walk due to back pain   Dyspnea:  limited mostly by cane but better on breztri with less need for albuterl  Cough: not productive/ mostly daytime  Sleeping: R side down/ lots of pillows under ead  SABA use: never prechallenges  02: none  Covid status: vax all but the lat  Lung cancer screening: try again today    No obvious day to day or daytime  variability or assoc excess/ purulent sputum or mucus plugs or hemoptysis or cp or chest tightness, subjective wheeze or overt sinus or hb symptoms.   Sleeping  without nocturnal  or early am exacerbation  of respiratory  c/o's or need for noct saba. Also denies any obvious fluctuation of symptoms with weather or environmental changes or other aggravating or alleviating factors except as outlined above   No unusual exposure hx or h/o childhood pna/ asthma or knowledge of premature birth.  Current Allergies, Complete Past Medical History, Past Surgical History, Family History, and Social History were reviewed in [employer] record.  ROS  The following are not active complaints unless bolded Hoarseness, sore throat, dysphagia, dental problems, itching, sneezing,  nasal congestion or discharge of excess mucus or purulent secretions, ear ache,   fever, chills, sweats, unintended wt loss or wt gain, classically pleuritic or exertional cp,  orthopnea pnd or arm/hand swelling  or leg swelling, presyncope, palpitations, abdominal pain, anorexia, nausea, vomiting, diarrhea  or change in bowel habits or change in bladder habits, change in stools or change in urine, dysuria, hematuria,  rash, arthralgias, visual complaints, headache, numbness, weakness or ataxia or problems with walking or coordination,  change in mood or  memory.        Current Meds  Medication Sig   albuterol (VENTOLIN HFA) 108 (90 Base) MCG/ACT inhaler Inhale 2 puffs into the lungs every 6 (six) hours as needed for wheezing or shortness of breath.   aspirin 81 MG tablet Take 81 mg by mouth daily.   clonazePAM (KLONOPIN) 0.5 MG tablet Take 0.5 mg by mouth 3 (three) times daily as needed for anxiety.   Fluticasone-Umeclidin-Vilant (TRELEGY ELLIPTA) 100-62.5-25 MCG/ACT AEPB Inhale 1 puff into the lungs daily.   gabapentin (NEURONTIN) 300 MG capsule Take 1 capsule (300 mg total) by mouth 3 (three) times daily.    hydrochlorothiazide (HYDRODIURIL) 25 MG tablet Take 25 mg by mouth daily.   ipratropium-albuterol (DUONEB) 0.5-2.5 (3) MG/3ML SOLN Take 3 mLs by nebulization every 4 (four) hours as needed (Asthma).   NIFEdipine (PROCARDIA XL/NIFEDICAL-XL) 90 MG 24 hr tablet Take 90 mg by mouth daily.   Omega-3 1000 MG CAPS Take 1,000 mg by mouth daily.   potassium chloride SA (K-DUR,KLOR-CON) 20 MEQ tablet Take 20 mEq by mouth daily.   simvastatin (ZOCOR) 20 MG tablet Take 20 mg by mouth daily.  terazosin (HYTRIN) 5 MG capsule Take 5 mg by mouth at bedtime.   traZODone (DESYREL) 150 MG tablet Take 150 mg by mouth at bedtime.   valsartan (DIOVAN) 320 MG tablet Take 320 mg by mouth daily.   venlafaxine XR (EFFEXOR-XR) 150 MG 24 hr capsule Take 150 mg by mouth daily with breakfast.                     Objective:   Physical Exam   Wts  04/04/2022          218  02/20/2022      220  03/01/2021     218  11/30/2020       215  01/21/2020     218  12/11/2019       215  10/23/2019       215   09/28/15 198 lb (89.812 kg)  08/31/15 197 lb (89.359 kg)  04/28/10 200 lb 8 oz (90.946 kg)    Vital signs reviewed  04/04/2022  - Note at rest 02 sats  96% on RA   General appearance:    amb animated bm nad   HEENT :  wearing mask    NECK :  without JVD/Nodes/TM/ nl carotid upstrokes bilaterally   LUNGS: no acc muscle use,  Mod barrel  contour chest wall with bilateral  Distant bs s audible wheeze and  without cough on insp or exp maneuvers and mod  Hyperresonant  to  percussion bilaterally     CV:  RRR  no s3 or murmur or increase in P2, and no edema   ABD:  soft and nontender with pos mid insp Hoover's  in the supine position. No bruits or organomegaly appreciated, bowel sounds nl  MS:   Ext warm without deformities or   obvious joint restrictions , calf tenderness, cyanosis or clubbing  SKIN: warm and dry without lesions    NEURO:  alert, approp, nl sensorium with  no motor or cerebellar deficits  apparent.                      Assessment:

## 2022-04-04 NOTE — Assessment & Plan Note (Signed)
Quit smoking 09/2015 PFT's April 28, 2010  FEV1 1.03 (33%) ratio 45 and no better p B2,  DLC0 65% - Stopped working Apr 21 2015  - 08/31/2015   try add spiriva resp to sym 160  - 08/31/2015   Walked RA x one lap @ 185 stopped due to  Sob, nl pace, no desat  PFT's  09/02/15 FEV1 0.88(32%) ratio 43 p 15% improvement from saba p symbicort 160 / spiriva prior ith DLCO  38 corrects to 81 for alv volume   - 09/05/2015 completed disability paperwork  - 09/28/2015 completed additional disability paperwork  - 10/23/2019  After extensive coaching inhaler device,  effectiveness =    50% > change breo to breztri  - PFT's 11/25/19   FEV1 0.81 (31 % ) ratio 0.51  p 0 % improvement from saba p  "3 inhalers"   prior to study with DLCO  13.48 (56%) corrects to 4.35 (105%)  for alv volume and FV curve classic severe concavity   -  12/11/2019   Walked RA  approx   400 ft  @ moderat pace  stopped due to  End of study c/o sob with sats still    Feet with sats still 96%  - 01/21/2020  After extensive coaching inhaler device,  effectiveness =    90% p coaching with dpi > try trelegy x 2 weeks as still too saba dep on breztri > no better on trelegy so back on Breztri> can't afford either - 11/30/2020  After extensive coaching inhaler device,  effectiveness =    90% dpi > tri advair 250 bid(or generic version = wixella)  - 02/20/2022   Walked on RA  x  2  lap(s) =  approx 300 ft  @ slow pace, stopped due to sob/back pain  with lowest 02 sats 93%   - 04/04/2022  After extensive coaching inhaler device,  effectiveness =    75% (short Ti) > continue breztri 2 each am to reduce cost with pm doses prn    Group D (now reclassified as E) in terms of symptom/risk and laba/lama/ICS  therefore appropriate rx at this point >>>  breztri and approp saba/ reviewed

## 2022-04-04 NOTE — Assessment & Plan Note (Signed)
Stopped 09/2015 but still using e cigs as of 10/23/2019  - referred to [facility] via [facility]  10/23/2019 > did not go >referred again 02/20/2022  and again 04/04/2022   Advised again against using vapes of any type but certainly prefer this over the known side effects of smoking cigarettes          Each maintenance medication was reviewed in detail including emphasizing most importantly the difference between maintenance and prns and under what circumstances the prns are to be triggered using an action plan format where appropriate.  Total time for H and P, chart review, counseling, reviewing hfa/neb  device(s) and generating customized AVS unique to this office visit / same day charting = 33 min

## 2022-04-16 DIAGNOSIS — M5416 Radiculopathy, lumbar region: Secondary | ICD-10-CM | POA: Diagnosis not present

## 2022-04-19 DIAGNOSIS — R69 Illness, unspecified: Secondary | ICD-10-CM | POA: Diagnosis not present

## 2022-04-19 DIAGNOSIS — R5383 Other fatigue: Secondary | ICD-10-CM | POA: Diagnosis not present

## 2022-04-19 DIAGNOSIS — Z299 Encounter for prophylactic measures, unspecified: Secondary | ICD-10-CM | POA: Diagnosis not present

## 2022-04-19 DIAGNOSIS — F17210 Nicotine dependence, cigarettes, uncomplicated: Secondary | ICD-10-CM | POA: Diagnosis not present

## 2022-04-19 DIAGNOSIS — I10 Essential (primary) hypertension: Secondary | ICD-10-CM | POA: Diagnosis not present

## 2022-04-19 DIAGNOSIS — M549 Dorsalgia, unspecified: Secondary | ICD-10-CM | POA: Diagnosis not present

## 2022-05-11 ENCOUNTER — Encounter: Payer: Self-pay | Admitting: *Deleted

## 2022-05-21 DIAGNOSIS — E291 Testicular hypofunction: Secondary | ICD-10-CM | POA: Diagnosis not present

## 2022-05-21 DIAGNOSIS — N521 Erectile dysfunction due to diseases classified elsewhere: Secondary | ICD-10-CM | POA: Diagnosis not present

## 2022-05-22 DIAGNOSIS — Z1330 Encounter for screening examination for mental health and behavioral disorders, unspecified: Secondary | ICD-10-CM | POA: Diagnosis not present

## 2022-05-22 DIAGNOSIS — M5416 Radiculopathy, lumbar region: Secondary | ICD-10-CM | POA: Diagnosis not present

## 2022-05-22 DIAGNOSIS — M961 Postlaminectomy syndrome, not elsewhere classified: Secondary | ICD-10-CM | POA: Diagnosis not present

## 2022-05-22 DIAGNOSIS — M5136 Other intervertebral disc degeneration, lumbar region: Secondary | ICD-10-CM | POA: Diagnosis not present

## 2022-05-22 DIAGNOSIS — M792 Neuralgia and neuritis, unspecified: Secondary | ICD-10-CM | POA: Diagnosis not present

## 2022-05-22 DIAGNOSIS — G609 Hereditary and idiopathic neuropathy, unspecified: Secondary | ICD-10-CM | POA: Diagnosis not present

## 2022-06-01 DIAGNOSIS — R7989 Other specified abnormal findings of blood chemistry: Secondary | ICD-10-CM | POA: Diagnosis not present

## 2022-06-01 DIAGNOSIS — Z6835 Body mass index (BMI) 35.0-35.9, adult: Secondary | ICD-10-CM | POA: Diagnosis not present

## 2022-06-01 DIAGNOSIS — M6289 Other specified disorders of muscle: Secondary | ICD-10-CM | POA: Insufficient documentation

## 2022-06-01 DIAGNOSIS — R350 Frequency of micturition: Secondary | ICD-10-CM | POA: Diagnosis not present

## 2022-06-01 DIAGNOSIS — G4700 Insomnia, unspecified: Secondary | ICD-10-CM | POA: Insufficient documentation

## 2022-06-01 DIAGNOSIS — R0683 Snoring: Secondary | ICD-10-CM | POA: Diagnosis not present

## 2022-06-01 DIAGNOSIS — R351 Nocturia: Secondary | ICD-10-CM | POA: Diagnosis not present

## 2022-06-01 DIAGNOSIS — E6609 Other obesity due to excess calories: Secondary | ICD-10-CM | POA: Diagnosis not present

## 2022-06-01 DIAGNOSIS — N529 Male erectile dysfunction, unspecified: Secondary | ICD-10-CM | POA: Diagnosis not present

## 2022-06-04 DIAGNOSIS — M47816 Spondylosis without myelopathy or radiculopathy, lumbar region: Secondary | ICD-10-CM | POA: Diagnosis not present

## 2022-06-05 ENCOUNTER — Other Ambulatory Visit: Payer: Self-pay

## 2022-06-05 DIAGNOSIS — Z122 Encounter for screening for malignant neoplasm of respiratory organs: Secondary | ICD-10-CM

## 2022-06-05 DIAGNOSIS — Z87891 Personal history of nicotine dependence: Secondary | ICD-10-CM

## 2022-06-13 DIAGNOSIS — E6609 Other obesity due to excess calories: Secondary | ICD-10-CM | POA: Diagnosis not present

## 2022-06-13 DIAGNOSIS — R7989 Other specified abnormal findings of blood chemistry: Secondary | ICD-10-CM | POA: Diagnosis not present

## 2022-06-13 DIAGNOSIS — Z6834 Body mass index (BMI) 34.0-34.9, adult: Secondary | ICD-10-CM | POA: Diagnosis not present

## 2022-06-13 DIAGNOSIS — N528 Other male erectile dysfunction: Secondary | ICD-10-CM | POA: Diagnosis not present

## 2022-06-13 DIAGNOSIS — N401 Enlarged prostate with lower urinary tract symptoms: Secondary | ICD-10-CM | POA: Diagnosis not present

## 2022-06-15 DIAGNOSIS — M549 Dorsalgia, unspecified: Secondary | ICD-10-CM | POA: Diagnosis not present

## 2022-06-18 DIAGNOSIS — M47816 Spondylosis without myelopathy or radiculopathy, lumbar region: Secondary | ICD-10-CM | POA: Diagnosis not present

## 2022-06-19 ENCOUNTER — Other Ambulatory Visit: Payer: Self-pay

## 2022-06-19 ENCOUNTER — Encounter (HOSPITAL_COMMUNITY): Payer: Self-pay

## 2022-06-19 ENCOUNTER — Emergency Department (HOSPITAL_COMMUNITY): Admission: EM | Admit: 2022-06-19 | Payer: Medicare HMO | Source: Home / Self Care

## 2022-06-19 DIAGNOSIS — Z1331 Encounter for screening for depression: Secondary | ICD-10-CM | POA: Diagnosis not present

## 2022-06-19 DIAGNOSIS — I10 Essential (primary) hypertension: Secondary | ICD-10-CM | POA: Diagnosis not present

## 2022-06-19 DIAGNOSIS — F419 Anxiety disorder, unspecified: Secondary | ICD-10-CM | POA: Diagnosis not present

## 2022-06-19 DIAGNOSIS — Z7189 Other specified counseling: Secondary | ICD-10-CM | POA: Diagnosis not present

## 2022-06-19 DIAGNOSIS — Z79899 Other long term (current) drug therapy: Secondary | ICD-10-CM | POA: Diagnosis not present

## 2022-06-19 DIAGNOSIS — Z0000 Encounter for general adult medical examination without abnormal findings: Secondary | ICD-10-CM | POA: Diagnosis not present

## 2022-06-19 DIAGNOSIS — Z1339 Encounter for screening examination for other mental health and behavioral disorders: Secondary | ICD-10-CM | POA: Diagnosis not present

## 2022-06-19 DIAGNOSIS — E7800 Pure hypercholesterolemia, unspecified: Secondary | ICD-10-CM | POA: Diagnosis not present

## 2022-06-19 DIAGNOSIS — Z299 Encounter for prophylactic measures, unspecified: Secondary | ICD-10-CM | POA: Diagnosis not present

## 2022-06-19 NOTE — ED Triage Notes (Signed)
Complaining of a cough that is not letting him sleep at night. Sent from md office for a chest x ray

## 2022-06-20 DIAGNOSIS — R059 Cough, unspecified: Secondary | ICD-10-CM | POA: Diagnosis not present

## 2022-06-25 DIAGNOSIS — E7800 Pure hypercholesterolemia, unspecified: Secondary | ICD-10-CM | POA: Diagnosis not present

## 2022-06-25 DIAGNOSIS — Z79899 Other long term (current) drug therapy: Secondary | ICD-10-CM | POA: Diagnosis not present

## 2022-06-25 DIAGNOSIS — Z0000 Encounter for general adult medical examination without abnormal findings: Secondary | ICD-10-CM | POA: Diagnosis not present

## 2022-06-25 DIAGNOSIS — Z125 Encounter for screening for malignant neoplasm of prostate: Secondary | ICD-10-CM | POA: Diagnosis not present

## 2022-06-25 DIAGNOSIS — I10 Essential (primary) hypertension: Secondary | ICD-10-CM | POA: Diagnosis not present

## 2022-06-25 DIAGNOSIS — R69 Illness, unspecified: Secondary | ICD-10-CM | POA: Diagnosis not present

## 2022-06-25 DIAGNOSIS — R609 Edema, unspecified: Secondary | ICD-10-CM | POA: Diagnosis not present

## 2022-06-25 DIAGNOSIS — F419 Anxiety disorder, unspecified: Secondary | ICD-10-CM | POA: Diagnosis not present

## 2022-06-25 DIAGNOSIS — R0602 Shortness of breath: Secondary | ICD-10-CM | POA: Diagnosis not present

## 2022-06-25 DIAGNOSIS — Z299 Encounter for prophylactic measures, unspecified: Secondary | ICD-10-CM | POA: Diagnosis not present

## 2022-06-28 NOTE — Progress Notes (Deleted)
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 Age 67 ? Dx ? And reports  flunked pfts for job around 2000 and on disability and since then  dx as copd in [place] 2011 p seen in pulmonary clinic 04/28/10 and referred back by Dr Anthonette Legato 08/31/2015  With gold III cpd criteria in Jan 2012     History of Present Illness  April 28, 2010  1st pulmonary office eval ov cc doe and cough onset in 40's and progressed to point where doe shopping leans on cart and struggles to get groceries back to his house (lives next to store). has noct wheeze disturbs sleep.  can't tell if dulera helpiing at this point not really using it. Dx gold III/IV copd  rec dulera 100 2bid / prn saba  Stop smoking   08/31/2015 1st EPIC  [facility] office visit//  Rylan Bernard  On symbicort 160 poor hfa  Chief Complaint  Patient presents with   Pulmonary Consult    Referred by Dr. Manuella Ghazi. Pt seen here in the past- last in 2012. He states that his breathing has been worse over the past few months. He states that he gets SOB walking "not far" and also when he gets upset about something.   wakes up coughing/ congested each am/bad days  can't walk across house s sob/other days does ok very slow to mailbox slt up hill back to house. Sob at rest when gets upset / some better with saba hfa and neb  rec Plan A = Automatic = Symbicort 160 Take 2 puffs first thing in am and then another 2 puffs about 12 hours later.  Spiriva 2 pffs each am (like high octane)  Plan B = Backup Only use your albuterol (proair) as a rescue medication  Plan C = Crisis - only use your albuterol nebulizer if you first try Plan B and it fails  Think of the e cigs as a one way bridge off all tobacco products     02/2020 COVID 19 infection      03/01/2021  f/u ov/[name]/Cherylyn Sundby re: GOLD 3 copd  maint  on duoneb about 4 x daily   Chief Complaint  Patient presents with   Follow-up    Unable to afford inhaler prescribed last time breo and advair he states he is using the rescue inhaler constantly.    Dyspnea:  limited by back pain  Cough: min am congestion  Sleeping: 30 degrees with flat bed/ pillows  SABA use: qid  02: none  Covid status: vax x 3 Lung cancer screening: referred  Rec I will be referring you to our lung cancer screening program which can be done at [facility] No change in your medications     02/20/2022  f/u ov/[place] office/Sudais Banghart re: GOLD 3 copd maint on advair 250  Chief Complaint  Patient presents with   Follow-up    Having issues with breathing   Dyspnea:  very little activity / unsure of meds Cough: none  Sleeping: on right side down/ flat bed lots of pillow  SABA use: too much, never prechallenges  02: has POC doesn't know how  to use  Rec Plan A = Automatic = Always=    Trelegy 297 one click each am take 2 good  drags Plan B = Backup (to supplement plan A, not to replace it) Only use your albuterol inhaler as a rescue medication  Plan C = Crisis (instead of Plan B but only if Plan B stops working) - only use your albuterol nebulizer if you first try Plan B  Please schedule a follow up office visit in 6 weeks, call sooner if needed with all medications /inhalers/ solutions in hand  Add referred again for LDSCT    04/04/2022  f/u ov/[place] office/Lizette Pazos re: GOLD 3  maint on breztri   Chief Complaint  Patient presents with   Follow-up    Feels breathing is worse than last ov.  Declined walk due to back pain   Dyspnea:  limited mostly by cane but better on breztri with less need for albuterl  Cough: not productive/ mostly daytime  Sleeping: R side down/ lots of pillows under ead  SABA use: never prechallenges  02: none  Covid status: vax all but the lat  Lung cancer screening: try again today  Rec  Continue Breztri but just 2 puffs in  am and the other 2 puffs 12 h later  are optional  Only use your albuterol as a rescue medication  Also  Ok to try albuterol 15 min before an activity (on alternating days)  that you know would usually make you short of breath      06/29/2022  f/u ov/[place] office/Arael Piccione re: *** maint on ***  No chief complaint on file.   Dyspnea:  *** Cough: *** Sleeping: *** SABA use: *** 02: *** Covid status: *** Lung cancer screening: due q April    No obvious day to day or daytime variability or assoc excess/ purulent sputum or mucus plugs or hemoptysis or cp or chest tightness, subjective wheeze or overt sinus or hb symptoms.   *** without nocturnal  or early am exacerbation  of respiratory  c/o's or need for noct saba. Also denies any obvious fluctuation of symptoms with weather or environmental changes or other aggravating or alleviating factors except as outlined above   No unusual exposure hx or h/o childhood pna/ asthma or knowledge of premature birth.  Current Allergies, Complete Past Medical History, Past Surgical History, Family History, and Social History were reviewed in [employer] record.  ROS  The following are not active complaints unless bolded Hoarseness, sore throat, dysphagia, dental problems, itching, sneezing,  nasal congestion or discharge of excess mucus or purulent secretions, ear ache,   fever, chills, sweats, unintended wt loss or wt gain, classically pleuritic or exertional cp,  orthopnea pnd or arm/hand swelling  or leg swelling, presyncope, palpitations, abdominal pain, anorexia, nausea, vomiting, diarrhea  or change in bowel habits or change in bladder habits, change in stools or change in urine, dysuria, hematuria,  rash, arthralgias, visual complaints, headache, numbness, weakness or ataxia or problems with walking or coordination,  change in mood or  memory.        No outpatient medications have been marked as taking for the 06/29/22 encounter  (Appointment) with Tanda Rockers, MD.                  Objective:   Physical Exam   Wts  06/29/2022        ***  04/04/2022          218  02/20/2022      220  03/01/2021     218  11/30/2020       215  01/21/2020     218  12/11/2019       215  10/23/2019       215   09/28/15 198 lb (89.812 kg)  08/31/15 197 lb (89.359 kg)  04/28/10 200 lb 8 oz (90.946 kg)    Vital signs reviewed  06/29/2022  - Note at rest 02 sats  ***% on ***   General appearance:    ***  Mod barr ***              Assessment:

## 2022-06-29 ENCOUNTER — Ambulatory Visit: Payer: Medicare HMO | Admitting: Internal Medicine

## 2022-07-10 ENCOUNTER — Encounter: Payer: Self-pay | Admitting: Acute Care

## 2022-07-10 ENCOUNTER — Ambulatory Visit (INDEPENDENT_AMBULATORY_CARE_PROVIDER_SITE_OTHER): Payer: Medicare HMO | Admitting: Acute Care

## 2022-07-10 DIAGNOSIS — Z87891 Personal history of nicotine dependence: Secondary | ICD-10-CM | POA: Diagnosis not present

## 2022-07-10 NOTE — Progress Notes (Signed)
Virtual Visit via Telephone Note  I connected with Sean Ramos on 07/10/22 at  1:30 PM EDT by telephone and verified that I am speaking with the correct person using two identifiers.  Location: Patient:  At home Provider: [address], [place], [place], [address]    I discussed the limitations, risks, security and privacy concerns of performing an evaluation and management service by telephone and the availability of in person appointments. I also discussed with the patient that there may be a patient responsible charge related to this service. The patient expressed understanding and agreed to proceed.   Shared Decision Making Visit Lung Cancer Screening Program [phone])   Eligibility: Age 67 y.o. Pack Years Smoking History Calculation 126.5 pack year smoking history (# packs/per year x # years smoked) Recent History of coughing up blood  no Unexplained weight loss? no ( >Than 15 pounds within the last 6 months ) Prior History Lung / other cancer no (Diagnosis within the last 5 years already requiring surveillance chest CT Scans). Smoking Status Former Smoker Former Smokers: Years since quit: 3 years  Quit Date: 2021  Visit Components: Discussion included one or more decision making aids. yes Discussion included risk/benefits of screening. yes Discussion included potential follow up diagnostic testing for abnormal scans. yes Discussion included meaning and risk of over diagnosis. yes Discussion included meaning and risk of False Positives. yes Discussion included meaning of total radiation exposure. yes  Counseling Included: Importance of adherence to annual lung cancer LDCT screening. yes Impact of comorbidities on ability to participate in the program. yes Ability and willingness to under diagnostic treatment. yes  Smoking Cessation Counseling: Current Smokers:  Discussed importance of smoking cessation. yes Information about tobacco cessation classes and  interventions provided to patient. yes Patient provided with "ticket" for LDCT Scan. yes Symptomatic Patient. no  Counseling NA Diagnosis Code: Tobacco Use Z72.0 Asymptomatic Patient yes  Counseling (Intermediate counseling: > three minutes counseling) U5427 Former Smokers:  Discussed the importance of maintaining cigarette abstinence. yes Diagnosis Code: Personal History of Nicotine Dependence. C62.376 Information about tobacco cessation classes and interventions provided to patient. Yes Patient provided with "ticket" for LDCT Scan. yes Written Order for Lung Cancer Screening with LDCT placed in Epic. Yes (CT Chest Lung Cancer Screening Low Dose W/O CM) [id number] Z12.2-Screening of respiratory organs Z87.891-Personal history of nicotine dependence  I spent 25 minutes of face to face time/virtual visit time  with  Mr. Rovner discussing the risks and benefits of lung cancer screening. We took the time to pause the power point at intervals to allow for questions to be asked and answered to ensure understanding. We discussed that he had taken the single most powerful action possible to decrease his risk of developing lung cancer when he quit smoking. I counseled him to remain smoke free, and to contact me if he ever had the desire to smoke again so that I can provide resources and tools to help support the effort to remain smoke free. We discussed the time and location of the scan, and that either  Abigail Miyamoto RN, Karlton Lemon, RN or I  or I will call / send a letter with the results within  24-72 hours of receiving them. He has the office contact information in the event he needs to speak with me,  he verbalized understanding of all of the above and had no further questions upon leaving the office.   Pt. Does still vape. I have counseled him to quit.  I explained to the patient that there has been a high incidence of coronary artery disease noted on these exams. I explained that this is a  non-gated exam therefore degree or severity cannot be determined. This patient is on statin therapy. I have asked the patient to follow-up with their PCP regarding any incidental finding of coronary artery disease and management with diet or medication as they feel is clinically indicated. The patient verbalized understanding of the above and had no further questions.     Bevelyn Ngo, NP 07/10/2022

## 2022-07-10 NOTE — Patient Instructions (Signed)
Thank you for participating in the [place] Lung Cancer Screening Program. It was our pleasure to meet you today. We will call you with the results of your scan within the next few days. Your scan will be assigned a Lung RADS category score by the physicians reading the scans.  This Lung RADS score determines follow up scanning.  See below for description of categories, and follow up screening recommendations. We will be in touch to schedule your follow up screening annually or based on recommendations of our providers. We will fax a copy of your scan results to your Primary Care Physician, or the physician who referred you to the program, to ensure they have the results. Please call the office if you have any questions or concerns regarding your scanning experience or results.  Our office number is [phone]. Please speak with Denise Phelps, RN. , or  Denise Buckner RN, They are  our Lung Cancer Screening RN.'s If They are unavailable when you call, Please leave a message on the voice mail. We will return your call at our earliest convenience.This voice mail is monitored several times a day.  Remember, if your scan is normal, we will scan you annually as long as you continue to meet the criteria for the program. (Age 50-80, Current smoker or smoker who has quit within the last 15 years). If you are a smoker, remember, quitting is the single most powerful action that you can take to decrease your risk of lung cancer and other pulmonary, breathing related problems. We know quitting is hard, and we are here to help.  Please let us know if there is anything we can do to help you meet your goal of quitting. If you are a former smoker, congratulations. We are proud of you! Remain smoke free! Remember you can refer friends or family members through the number above.  We will screen them to make sure they meet criteria for the program. Thank you for helping us take better care of you by  participating in Lung Screening.  You can receive free nicotine replacement therapy ( patches, gum or mints) by calling 1-800-QUIT NOW. Please call so we can get you on the path to becoming  a non-smoker. I know it is hard, but you can do this!  Lung RADS Categories:  Lung RADS 1: no nodules or definitely non-concerning nodules.  Recommendation is for a repeat annual scan in 12 months.  Lung RADS 2:  nodules that are non-concerning in appearance and behavior with a very low likelihood of becoming an active cancer. Recommendation is for a repeat annual scan in 12 months.  Lung RADS 3: nodules that are probably non-concerning , includes nodules with a low likelihood of becoming an active cancer.  Recommendation is for a 6-month repeat screening scan. Often noted after an upper respiratory illness. We will be in touch to make sure you have no questions, and to schedule your 6-month scan.  Lung RADS 4 A: nodules with concerning findings, recommendation is most often for a follow up scan in 3 months or additional testing based on our provider's assessment of the scan. We will be in touch to make sure you have no questions and to schedule the recommended 3 month follow up scan.  Lung RADS 4 B:  indicates findings that are concerning. We will be in touch with you to schedule additional diagnostic testing based on our provider's  assessment of the scan.  Other options for assistance in smoking cessation (   As covered by your insurance benefits)  Hypnosis for smoking cessation  [employer] [phone]  Acupuncture for smoking cessation  [facility] [phone]   

## 2022-07-24 DIAGNOSIS — N1831 Chronic kidney disease, stage 3a: Secondary | ICD-10-CM | POA: Diagnosis not present

## 2022-07-24 DIAGNOSIS — R609 Edema, unspecified: Secondary | ICD-10-CM | POA: Diagnosis not present

## 2022-07-24 DIAGNOSIS — Z299 Encounter for prophylactic measures, unspecified: Secondary | ICD-10-CM | POA: Diagnosis not present

## 2022-07-24 DIAGNOSIS — I10 Essential (primary) hypertension: Secondary | ICD-10-CM | POA: Diagnosis not present

## 2022-07-24 DIAGNOSIS — M549 Dorsalgia, unspecified: Secondary | ICD-10-CM | POA: Diagnosis not present

## 2022-07-26 DIAGNOSIS — M47816 Spondylosis without myelopathy or radiculopathy, lumbar region: Secondary | ICD-10-CM | POA: Diagnosis not present

## 2022-07-26 DIAGNOSIS — J449 Chronic obstructive pulmonary disease, unspecified: Secondary | ICD-10-CM | POA: Diagnosis not present

## 2022-07-26 DIAGNOSIS — Z79899 Other long term (current) drug therapy: Secondary | ICD-10-CM | POA: Diagnosis not present

## 2022-07-30 DIAGNOSIS — R9439 Abnormal result of other cardiovascular function study: Secondary | ICD-10-CM | POA: Diagnosis not present

## 2022-07-31 ENCOUNTER — Ambulatory Visit (HOSPITAL_COMMUNITY)
Admission: RE | Admit: 2022-07-31 | Discharge: 2022-07-31 | Disposition: A | Discharge status: Home / Self Care | Payer: Medicare HMO | Source: Ambulatory Visit | Attending: Internal Medicine | Admitting: Internal Medicine

## 2022-07-31 DIAGNOSIS — Z122 Encounter for screening for malignant neoplasm of respiratory organs: Secondary | ICD-10-CM | POA: Insufficient documentation

## 2022-07-31 DIAGNOSIS — Z87891 Personal history of nicotine dependence: Secondary | ICD-10-CM | POA: Insufficient documentation

## 2022-07-31 LAB — POCT I-STAT CREATININE: Creatinine, Ser: 1.1 mg/dL (ref 0.61–1.24)

## 2022-08-06 ENCOUNTER — Other Ambulatory Visit: Payer: Self-pay

## 2022-08-06 DIAGNOSIS — Z87891 Personal history of nicotine dependence: Secondary | ICD-10-CM

## 2022-08-06 DIAGNOSIS — Z122 Encounter for screening for malignant neoplasm of respiratory organs: Secondary | ICD-10-CM

## 2022-08-09 ENCOUNTER — Ambulatory Visit: Payer: Medicare HMO | Admitting: Internal Medicine

## 2022-08-09 DIAGNOSIS — M549 Dorsalgia, unspecified: Secondary | ICD-10-CM | POA: Diagnosis not present

## 2022-08-10 ENCOUNTER — Encounter: Payer: Self-pay | Admitting: Internal Medicine

## 2022-08-10 ENCOUNTER — Ambulatory Visit (INDEPENDENT_AMBULATORY_CARE_PROVIDER_SITE_OTHER): Payer: Medicare HMO | Admitting: Internal Medicine

## 2022-08-10 VITALS — BP 119/82 | HR 104 | Temp 98.0°F | Ht 66.5 in | Wt 203.4 lb

## 2022-08-10 DIAGNOSIS — J449 Chronic obstructive pulmonary disease, unspecified: Secondary | ICD-10-CM | POA: Diagnosis not present

## 2022-08-10 DIAGNOSIS — R0609 Other forms of dyspnea: Secondary | ICD-10-CM | POA: Insufficient documentation

## 2022-08-10 DIAGNOSIS — R058 Other specified cough: Secondary | ICD-10-CM | POA: Insufficient documentation

## 2022-08-10 MED ORDER — BREZTRI AEROSPHERE 160-9-4.8 MCG/ACT IN AERO: INHALATION_SPRAY | RESPIRATORY_TRACT | 11 refills | Status: DC

## 2022-08-10 MED ORDER — FAMOTIDINE 20 MG PO TABS
ORAL_TABLET | ORAL | 11 refills | Status: DC
Start: 2022-08-10 — End: 2022-08-10

## 2022-08-10 MED ORDER — PANTOPRAZOLE SODIUM 40 MG PO TBEC
40.0000 mg | DELAYED_RELEASE_TABLET | Freq: Every day | ORAL | 2 refills | Status: DC
Start: 2022-08-10 — End: 2022-08-10

## 2022-08-10 MED ORDER — FAMOTIDINE 20 MG PO TABS: ORAL_TABLET | ORAL | 11 refills | Status: AC

## 2022-08-10 MED ORDER — BREZTRI AEROSPHERE 160-9-4.8 MCG/ACT IN AERO: 2.0000 | INHALATION_SPRAY | Freq: Two times a day (BID) | RESPIRATORY_TRACT | 0 refills | Status: DC

## 2022-08-10 MED ORDER — PANTOPRAZOLE SODIUM 40 MG PO TBEC
40.0000 mg | DELAYED_RELEASE_TABLET | Freq: Every day | ORAL | 2 refills | Status: DC
Start: 2022-08-10 — End: 2022-11-08

## 2022-08-10 MED ORDER — METHYLPREDNISOLONE ACETATE 80 MG/ML IJ SUSP
120.0000 mg | Freq: Once | INTRAMUSCULAR | Status: AC
Start: 2022-08-10 — End: 2022-08-10
  Administered 2022-08-10: 120 mg via INTRAMUSCULAR

## 2022-08-10 NOTE — Progress Notes (Addendum)
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 Age 67 ? Dx ? And reports  flunked pfts for job around 2000 and on disability and since then  dx as copd in [place] 2011 p seen in pulmonary clinic 04/28/10 and referred back by Dr Franchot Mimes 08/31/2015  With gold III cpd criteria in Jan 2012     History of Present Illness  April 28, 2010  1st pulmonary office eval ov cc doe and cough onset in 40's and progressed to point where doe shopping leans on cart and struggles to get groceries back to his house (lives next to store). has noct wheeze disturbs sleep.  can't tell if dulera helpiing at this point not really using it. Dx gold III/IV copd  rec dulera 100 2bid / prn saba  Stop smoking   08/31/2015 1st EPIC  [facility] office visit//  Sean Ramos  On symbicort 160 poor hfa  Chief Complaint  Patient presents with   Pulmonary Consult    Referred by Dr. Sherryll Burger. Pt seen here in the past- last in 2012. He states that his breathing has been worse over the past few months. He states that he gets SOB walking "not far" and also when he gets upset about something.   wakes up coughing/ congested each am/bad days  can't walk across house s sob/other days does ok very slow to mailbox slt up hill back to house. Sob at rest when gets upset / some better with saba hfa and neb  rec Plan A = Automatic = Symbicort 160 Take 2 puffs first thing in am and then another 2 puffs about 12 hours later.  Spiriva 2 pffs each am (like high octane)  Plan B = Backup Only use your albuterol (proair) as a rescue medication  Plan C = Crisis - only use your albuterol nebulizer if you first try Plan B and it fails  Think of the e cigs as a one way bridge off all tobacco products     02/2020 COVID 19 infection      02/20/2022  f/u ov/[place] office/Sean Ramos re: GOLD 3 copd maint  on advair 250  Chief Complaint  Patient presents with   Follow-up    Having issues with breathing   Dyspnea:  very little activity / unsure of meds Cough: none  Sleeping: on right side down/ flat bed lots of pillow  SABA use: too much, never prechallenges  02: has POC doesn't know how  to use  Rec Plan A = Automatic = Always=    Trelegy 100 one click each am take 2 good drags Plan B = Backup (to supplement plan A, not to replace it) Only use your albuterol inhaler as a rescue medication  Plan C = Crisis (instead of Plan B but only if Plan B stops working) - only use your albuterol nebulizer if you first try Plan B  Please schedule a follow up office visit in 6 weeks, call sooner if needed with all medications /inhalers/ solutions in hand  Add referred again for LDSCT    04/04/2022  f/u ov/[place] office/Sean Ramos re: GOLD 3  maint on breztri   Chief Complaint  Patient presents with   Follow-up    Feels breathing is worse than last ov.  Declined walk  due to back pain   Dyspnea:  limited mostly by cane but better on breztri with less need for albuterol  Cough: not productive/ mostly daytime  Sleeping: R side down/ lots of pillows under head SABA use: never prechallenges  02: none  Covid status: vax all but the lat  Lung cancer screening: try again today  Rec Continue Breztri but just 2 puffs in am and the other 2 pufs 12 h later  are optional  Only use your albuterol as a rescue medication  Also  Ok to try albuterol 15 min before an activity (on alternating days)  that you know would usually make you short of breath   08/10/2022  f/u ov/[place] office/Sean Ramos re: GOLD 3  maint on Breztri when he can use it  but just taking one bid  Chief Complaint  Patient presents with   Follow-up    SOB with exertion and wheezing.   Dyspnea:  worse since cough flared Cough: dry daytime with "grinding" upper airway quality/ throat clearing - worsened since stopped gabapentin assoc with wheezing   Sleeping: flat bed / lots of pillows less cough noct  SABA use: not helping  02: none    Lung cancer screening: done f/430/24 see below    No obvious day to day or daytime variability or assoc excess/ purulent sputum or mucus plugs or hemoptysis or cp or chest tightness,  or overt sinus or hb symptoms.    .Also denies any obvious fluctuation of symptoms with weather or environmental changes or other aggravating or alleviating factors except as outlined above   No unusual exposure hx or h/o childhood pna/ asthma or knowledge of premature birth.  Current Allergies, Complete Past Medical History, Past Surgical History, Family History, and Social History were reviewed in [employer] record.  ROS  The following are not active complaints unless bolded Hoarseness, sore throat (globus) , dysphagia, dental problems, itching, sneezing,  nasal congestion or discharge of excess mucus or purulent secretions, ear ache,   fever, chills, sweats, unintended wt loss or wt gain, classically pleuritic or exertional cp,  orthopnea pnd or arm/hand swelling  or leg swelling, presyncope, palpitations, abdominal pain, anorexia, nausea, vomiting, diarrhea  or change in bowel habits or change in bladder habits, change in stools or change in urine, dysuria, hematuria,  rash, arthralgias, visual complaints, headache, numbness, weakness or ataxia or problems with walking or coordination,  change in mood/ anxious  or  memory.        Current Meds   Medication Sig   albuterol (VENTOLIN HFA) 108 (90 Base) MCG/ACT inhaler Inhale 2 puffs into the lungs every 6 (six) hours as needed for wheezing or shortness of breath.   aspirin 81 MG tablet Take 81 mg by mouth daily.   clonazePAM (KLONOPIN) 0.5 MG tablet Take 0.5 mg by mouth 3 (three) times daily as needed for anxiety.   hydrochlorothiazide (HYDRODIURIL) 25 MG tablet Take 25 mg by mouth daily.   ipratropium-albuterol (DUONEB) 0.5-2.5 (3) MG/3ML SOLN  Take 3 mLs by nebulization every 4 (four) hours as needed (Asthma).   NIFEdipine (PROCARDIA XL/NIFEDICAL-XL) 90 MG 24 hr tablet Take 90 mg by mouth daily.   Omega-3 1000 MG CAPS Take 1,000 mg by mouth daily.   potassium chloride SA (K-DUR,KLOR-CON) 20 MEQ tablet Take 20 mEq by mouth daily.   simvastatin (ZOCOR) 20 MG tablet Take 20 mg by mouth daily.   terazosin (HYTRIN) 5 MG capsule Take 5 mg by mouth at  bedtime.   traZODone (DESYREL) 150 MG tablet Take 150 mg by mouth at bedtime.   valsartan (DIOVAN) 320 MG tablet Take 320 mg by mouth daily.   venlafaxine XR (EFFEXOR-XR) 150 MG 24 hr capsule Take 150 mg by mouth daily with breakfast.                  Objective:   Physical Exam   Wts  08/10/2022        203  04/04/2022          218  02/20/2022      220  03/01/2021     218  11/30/2020       215  01/21/2020     218  12/11/2019       215  10/23/2019       215   09/28/15 198 lb (89.812 kg)  08/31/15 197 lb (89.359 kg)  04/28/10 200 lb 8 oz (90.946 kg)    Vital signs reviewed  08/10/2022  - Note at rest 02 sats  94% on RA   General appearance:    amb somber bm grinding throat  HEENT :  Oropharynx  min eythema  Nasal turbinates nl    NECK :  without JVD/Nodes/TM/ nl carotid upstrokes bilaterally   LUNGS: no acc muscle use,  Mod barrel  contour chest wall with bilateral  Distant bs s audible wheeze and  without cough on insp or exp maneuvers and mod  Hyperresonant  to  percussion bilaterally     CV:  RRR  no s3 or murmur or increase in P2, and no edema   ABD:  soft and nontender with pos mid insp Hoover's  in the supine position. No bruits or organomegaly appreciated, bowel sounds nl  MS:   Ext warm without deformities or   obvious joint restrictions , calf tenderness, cyanosis or clubbing  SKIN: warm and dry without lesions    NEURO:  alert, approp, nl sensorium with  no motor or cerebellar deficits apparent.              I personally reviewed images and agree with  radiology impression as follows:   Chest LDSCT  07/31/22 1. Lung-RADS 1, negative. Continue annual screening with low-dose chest CT without contrast in 12 months. 2. Moderate coronary artery calcifications  3. Aortic Atherosclerosis (ICD10-I70.0) and Emphysema (ICD10-J43.9).     Assessment:

## 2022-08-10 NOTE — Patient Instructions (Addendum)
Plan A = Automatic = Always=    Breztri Take 2 puffs first thing in am and then another 2 puffs about 12 hours later.     Work on inhaler technique:  relax and gently blow all the way out then take a nice smooth full deep breath back in, triggering the inhaler at same time you start breathing in.  Hold breath in for at least  5 seconds if you can. Blow out breztri  thru nose. Rinse and gargle with water when done.  If mouth or throat bother you at all,  try brushing teeth/gums/tongue with arm and hammer toothpaste/ make a slurry and gargle and spit out.       Plan B = Backup (to supplement plan A, not to replace it) Only use your albuterol inhaler as a rescue medication to be used if you can't catch your breath by resting or doing a relaxed purse lip breathing pattern.  - The less you use it, the better it will work when you need it. - Ok to use the inhaler up to 2 puffs  every 4 hours if you must but call for appointment if use goes up over your usual need - Don't leave home without it !!  (think of it like the spare tire for your car)   Plan C = Crisis (instead of Plan B but only if Plan B stops working) - only use your albuterol nebulizer if you first try Plan B and it fails to help > ok to use the nebulizer up to every 4 hours but if start needing it regularly call for immediate appointment  Gabapentin 300 mg one half twice daily  Pantoprazole (protonix) 40 mg   Take  30-60 min before first meal of the day and Pepcid (famotidine)  20 mg after supper until return to office - this is the best way to tell whether stomach acid is contributing to your problem.   GERD (REFLUX)  is an extremely common cause of respiratory symptoms just like yours , many times with no obvious heartburn at all.    It can be treated with medication, but also with lifestyle changes including elevation of the head of your bed (ideally with 6 -8inch blocks under the headboard of your bed),  Smoking cessation, avoidance  of late meals, excessive alcohol, and avoid fatty foods, chocolate, peppermint, colas, red wine, and acidic juices such as orange juice.  NO MINT OR MENTHOL PRODUCTS SO NO COUGH DROPS  USE SUGARLESS CANDY INSTEAD (Jolley ranchers or Stover's or Life Savers) or even ice chips will also do - the key is to swallow to prevent all throat clearing. NO OIL BASED VITAMINS - use powdered substitutes.  Avoid fish oil when coughing.   Please remember to go to the lab department   for your tests - we will call you with the results when they are available.      Depomedrol 120 mg IM   Please schedule a follow up office visit in 4 weeks, sooner if needed  with all medications /inhalers/ solutions in hand so we can verify exactly what you are taking. This includes all medications from all doctors and over the counters

## 2022-08-11 NOTE — Assessment & Plan Note (Addendum)
Quit smoking 09/2015 PFT's April 28, 2010  FEV1 1.03 (33%) ratio 45 and no better p B2,  DLC0 65% - Stopped working Apr 21 2015  - 08/31/2015   try add spiriva resp to sym 160  - 08/31/2015   Walked RA x one lap @ 185 stopped due to  Sob, nl pace, no desat  PFT's  09/02/15 FEV1 0.88(32%) ratio 43 p 15% improvement from saba p symbicort 160 / spiriva prior ith DLCO  38 corrects to 81 for alv volume   - 09/05/2015 completed disability paperwork  - 09/28/2015 completed additional disability paperwork  - 10/23/2019  After extensive coaching inhaler device,  effectiveness =    50% > change breo to breztri  - PFT's 11/25/19   FEV1 0.81 (31 % ) ratio 0.51  p 0 % improvement from saba p  "3 inhalers"   prior to study with DLCO  13.48 (56%) corrects to 4.35 (105%)  for alv volume and FV curve classic severe concavity   -  12/11/2019   Walked RA  approx   400 ft  @ moderat pace  stopped due to  End of study c/o sob with sats still    Feet with sats still 96%  - 01/21/2020  After extensive coaching inhaler device,  effectiveness =    90% p coaching with dpi > try trelegy x 2 weeks as still too saba dep on breztri > no better on trelegy so back on Breztri> can't afford either - 11/30/2020  After extensive coaching inhaler device,  effectiveness =    90% dpi > tri advair 250 bid(or generic version = [facility])  - 02/20/2022   Walked on RA  x  2  lap(s) =  approx 300 ft  @ slow pace, stopped due to sob/back pain  with lowest 02 sats 93%   - 04/04/2022  After extensive coaching inhaler device,  effectiveness =    75% (short Ti) > continue breztri 2 each am to reduce cost with pm doses prn    Group D (now reclassified as E) in terms of symptom/risk and laba/lama/ICS  therefore appropriate rx at this point >>>  breztri and approp saba  - the challenge is affordability of meds but for now gave him enough samples of Breztri to use it as rec = Take 2 puffs first thing in am and then another 2 puffs about 12 hours later until next  ov.   Cough  appears to be upper airway in nature  (see separate a/p)

## 2022-08-11 NOTE — Assessment & Plan Note (Addendum)
Flared spring of 2024 w/in weeks of stopping gabapentin - Gerd rx 08/10/2022 and gabapentin titrated to max of 1200 mg daily   Upper airway cough syndrome (previously labeled PNDS),  is so named because it's frequently impossible to sort out how much is  CR/sinusitis with freq throat clearing (which can be related to primary GERD)   vs  causing  secondary (" extra esophageal")  GERD from wide swings in gastric pressure that occur with throat clearing, often  promoting self use of mint and menthol lozenges that reduce the lower esophageal sphincter tone and exacerbate the problem further in a cyclical fashion.   These are the same pts (now being labeled as having "irritable larynx syndrome" by some cough centers) who not infrequently have a history of having failed to tolerate ace inhibitors,  dry powder inhalers or biphosphonates or report having atypical/extraesophageal reflux symptoms that don't respond to standard doses of PPI  and are easily confused as having aecopd or asthma flares by even experienced allergists/ pulmonologists (myself included).  Recs as above, recheck in 4 weeks  with all meds in hand using a trust but verify approach to confirm accurate Medication  Reconciliation The principal here is that until we are certain that the  patients are doing what we've asked, it makes no sense to ask them to do more.          Each maintenance medication was reviewed in detail including emphasizing most importantly the difference between maintenance and prns and under what circumstances the prns are to be triggered using an action plan format where appropriate.  Total time for H and P, chart review, counseling, reviewing hfa/neb  device(s) and generating customized AVS unique to this office visit / same day charting  > 30 min including evaluation of new cough

## 2022-08-14 ENCOUNTER — Other Ambulatory Visit: Payer: Self-pay | Admitting: Internal Medicine

## 2022-08-14 DIAGNOSIS — R058 Other specified cough: Secondary | ICD-10-CM

## 2022-08-14 DIAGNOSIS — R0609 Other forms of dyspnea: Secondary | ICD-10-CM

## 2022-08-14 LAB — CBC WITH DIFFERENTIAL/PLATELET
Basophils Absolute: 0 10*3/uL (ref 0.0–0.2)
Basos: 1 %
EOS (ABSOLUTE): 0.1 10*3/uL (ref 0.0–0.4)
Eos: 2 %
Hematocrit: 42.5 % (ref 37.5–51.0)
Hemoglobin: 12.9 g/dL — ABNORMAL LOW (ref 13.0–17.7)
Immature Grans (Abs): 0 10*3/uL (ref 0.0–0.1)
Immature Granulocytes: 0 %
Lymphocytes Absolute: 0.9 10*3/uL (ref 0.7–3.1)
Lymphs: 14 %
MCH: 24.5 pg — ABNORMAL LOW (ref 26.6–33.0)
MCHC: 30.4 g/dL — ABNORMAL LOW (ref 31.5–35.7)
MCV: 81 fL (ref 79–97)
Monocytes Absolute: 0.6 10*3/uL (ref 0.1–0.9)
Monocytes: 9 %
Neutrophils Absolute: 4.7 10*3/uL (ref 1.4–7.0)
Neutrophils: 74 %
Platelets: 202 10*3/uL (ref 150–450)
RBC: 5.27 x10E6/uL (ref 4.14–5.80)
RDW: 14.1 % (ref 11.6–15.4)
WBC: 6.3 10*3/uL (ref 3.4–10.8)

## 2022-08-14 LAB — BASIC METABOLIC PANEL
BUN/Creatinine Ratio: 14 (ref 10–24)
BUN: 18 mg/dL (ref 8–27)
CO2: 23 mmol/L (ref 20–29)
Calcium: 9.4 mg/dL (ref 8.6–10.2)
Chloride: 103 mmol/L (ref 96–106)
Creatinine, Ser: 1.31 mg/dL — ABNORMAL HIGH (ref 0.76–1.27)
Glucose: 103 mg/dL — ABNORMAL HIGH (ref 70–99)
Potassium: 4.2 mmol/L (ref 3.5–5.2)
Sodium: 143 mmol/L (ref 134–144)
eGFR: 59 mL/min/{1.73_m2} — ABNORMAL LOW (ref >59)

## 2022-08-14 LAB — SEDIMENTATION RATE: Sed Rate: 43 mm/hr — ABNORMAL HIGH (ref 0–30)

## 2022-08-14 LAB — D-DIMER, QUANTITATIVE: D-DIMER: 1.06 mg/L FEU — ABNORMAL HIGH (ref 0.00–0.49)

## 2022-08-14 LAB — IGE: IgE (Immunoglobulin E), Serum: 1826 IU/mL — ABNORMAL HIGH (ref 6–495)

## 2022-08-14 LAB — BRAIN NATRIURETIC PEPTIDE: BNP: 17.6 pg/mL (ref 0.0–100.0)

## 2022-08-14 LAB — TSH: TSH: 0.909 u[IU]/mL (ref 0.450–4.500)

## 2022-08-14 NOTE — Progress Notes (Signed)
Spoke with pt and notified of results per Dr. Sherene Sires. Pt verbalized understanding and denied any questions. Pt was agreeable to referral and this was placed.

## 2022-08-15 DIAGNOSIS — M5416 Radiculopathy, lumbar region: Secondary | ICD-10-CM | POA: Diagnosis not present

## 2022-08-15 DIAGNOSIS — M792 Neuralgia and neuritis, unspecified: Secondary | ICD-10-CM | POA: Diagnosis not present

## 2022-08-17 ENCOUNTER — Telehealth: Payer: Self-pay | Admitting: Internal Medicine

## 2022-08-17 NOTE — Telephone Encounter (Signed)
Spoke with patient. He advises he does have issues voiding on occasion. But states with all these medical appointments/bills he is not able to afford another inhaler  Routing to Dr. Sherene Sires as an [place]ain Childes

## 2022-08-17 NOTE — Telephone Encounter (Signed)
Fax received from Dr. Alta Corning Terlecki with [facility] to perform a Rezum Prostate Therapy on patient.  Patient needs surgery clearance. Surgery is 09/17/22. Patient was seen on 08/10/22. Office protocol is a risk assessment can be sent to surgeon if patient has been seen in 60 days or less.   Sending to Dr. Sherene Sires for risk assessment or recommendations if patient needs to be seen in office prior to surgical procedure.

## 2022-08-17 NOTE — Telephone Encounter (Signed)
Cleared for surgery but if having trouble voiding needs change to symbicort 80 2bid (ok to use generic) to see if helps him in meantime and if so let his urologist know

## 2022-08-22 NOTE — Telephone Encounter (Signed)
OV notes and clearance form have been faxed back to [facility]. Nothing further needed at this time.

## 2022-08-23 DIAGNOSIS — G629 Polyneuropathy, unspecified: Secondary | ICD-10-CM | POA: Diagnosis not present

## 2022-09-17 DIAGNOSIS — N401 Enlarged prostate with lower urinary tract symptoms: Secondary | ICD-10-CM | POA: Diagnosis not present

## 2022-09-17 DIAGNOSIS — Z8673 Personal history of transient ischemic attack (TIA), and cerebral infarction without residual deficits: Secondary | ICD-10-CM | POA: Diagnosis not present

## 2022-09-17 DIAGNOSIS — Z7982 Long term (current) use of aspirin: Secondary | ICD-10-CM | POA: Diagnosis not present

## 2022-09-17 DIAGNOSIS — Z87891 Personal history of nicotine dependence: Secondary | ICD-10-CM | POA: Diagnosis not present

## 2022-09-17 DIAGNOSIS — J449 Chronic obstructive pulmonary disease, unspecified: Secondary | ICD-10-CM | POA: Diagnosis not present

## 2022-09-17 DIAGNOSIS — E785 Hyperlipidemia, unspecified: Secondary | ICD-10-CM | POA: Diagnosis not present

## 2022-09-17 DIAGNOSIS — N400 Enlarged prostate without lower urinary tract symptoms: Secondary | ICD-10-CM | POA: Diagnosis not present

## 2022-09-17 DIAGNOSIS — Z79899 Other long term (current) drug therapy: Secondary | ICD-10-CM | POA: Diagnosis not present

## 2022-09-17 DIAGNOSIS — I10 Essential (primary) hypertension: Secondary | ICD-10-CM | POA: Diagnosis not present

## 2022-09-17 DIAGNOSIS — I700 Atherosclerosis of aorta: Secondary | ICD-10-CM | POA: Diagnosis not present

## 2022-09-18 DIAGNOSIS — Z01810 Encounter for preprocedural cardiovascular examination: Secondary | ICD-10-CM | POA: Diagnosis not present

## 2022-09-20 ENCOUNTER — Other Ambulatory Visit: Payer: Self-pay | Admitting: Internal Medicine

## 2022-09-20 ENCOUNTER — Ambulatory Visit: Payer: Medicare HMO | Admitting: Internal Medicine

## 2022-09-21 DIAGNOSIS — Z7982 Long term (current) use of aspirin: Secondary | ICD-10-CM | POA: Diagnosis not present

## 2022-09-21 DIAGNOSIS — T839XXA Unspecified complication of genitourinary prosthetic device, implant and graft, initial encounter: Secondary | ICD-10-CM | POA: Diagnosis not present

## 2022-09-21 DIAGNOSIS — N400 Enlarged prostate without lower urinary tract symptoms: Secondary | ICD-10-CM | POA: Diagnosis not present

## 2022-09-21 DIAGNOSIS — J449 Chronic obstructive pulmonary disease, unspecified: Secondary | ICD-10-CM | POA: Diagnosis not present

## 2022-09-21 DIAGNOSIS — Z87891 Personal history of nicotine dependence: Secondary | ICD-10-CM | POA: Diagnosis not present

## 2022-09-21 DIAGNOSIS — R1030 Lower abdominal pain, unspecified: Secondary | ICD-10-CM | POA: Diagnosis not present

## 2022-09-21 DIAGNOSIS — I10 Essential (primary) hypertension: Secondary | ICD-10-CM | POA: Diagnosis not present

## 2022-09-21 DIAGNOSIS — N4889 Other specified disorders of penis: Secondary | ICD-10-CM | POA: Diagnosis not present

## 2022-09-21 DIAGNOSIS — Z7951 Long term (current) use of inhaled steroids: Secondary | ICD-10-CM | POA: Diagnosis not present

## 2022-09-21 DIAGNOSIS — Y846 Urinary catheterization as the cause of abnormal reaction of the patient, or of later complication, without mention of misadventure at the time of the procedure: Secondary | ICD-10-CM | POA: Diagnosis not present

## 2022-09-24 ENCOUNTER — Ambulatory Visit: Payer: Medicare HMO | Admitting: Internal Medicine

## 2022-09-24 DIAGNOSIS — N401 Enlarged prostate with lower urinary tract symptoms: Secondary | ICD-10-CM | POA: Diagnosis not present

## 2022-09-25 DIAGNOSIS — M5416 Radiculopathy, lumbar region: Secondary | ICD-10-CM | POA: Diagnosis not present

## 2022-09-25 DIAGNOSIS — G894 Chronic pain syndrome: Secondary | ICD-10-CM | POA: Diagnosis not present

## 2022-10-02 DIAGNOSIS — N401 Enlarged prostate with lower urinary tract symptoms: Secondary | ICD-10-CM | POA: Diagnosis not present

## 2022-10-02 DIAGNOSIS — R3912 Poor urinary stream: Secondary | ICD-10-CM | POA: Diagnosis not present

## 2022-10-02 DIAGNOSIS — R7989 Other specified abnormal findings of blood chemistry: Secondary | ICD-10-CM | POA: Diagnosis not present

## 2022-10-02 DIAGNOSIS — R319 Hematuria, unspecified: Secondary | ICD-10-CM | POA: Diagnosis not present

## 2022-10-05 DIAGNOSIS — E291 Testicular hypofunction: Secondary | ICD-10-CM | POA: Diagnosis not present

## 2022-10-05 DIAGNOSIS — N521 Erectile dysfunction due to diseases classified elsewhere: Secondary | ICD-10-CM | POA: Diagnosis not present

## 2022-10-08 DIAGNOSIS — M47816 Spondylosis without myelopathy or radiculopathy, lumbar region: Secondary | ICD-10-CM | POA: Diagnosis not present

## 2022-10-08 DIAGNOSIS — M5136 Other intervertebral disc degeneration, lumbar region: Secondary | ICD-10-CM | POA: Diagnosis not present

## 2022-10-08 DIAGNOSIS — M5416 Radiculopathy, lumbar region: Secondary | ICD-10-CM | POA: Diagnosis not present

## 2022-10-08 DIAGNOSIS — M7138 Other bursal cyst, other site: Secondary | ICD-10-CM | POA: Diagnosis not present

## 2022-10-14 NOTE — Progress Notes (Deleted)
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 Age 67 ? Dx ? And reports  flunked pfts for job around 2000 and on disability and since then  dx as copd in [place] 2011 p seen in pulmonary clinic 04/28/10 and referred back by Dr Franchot Mimes 08/31/2015  With gold III cpd criteria in Jan 2012     History of Present Illness  April 28, 2010  1st pulmonary office eval ov cc doe and cough onset in 40's and progressed to point where doe shopping leans on cart and struggles to get groceries back to his house (lives next to store). has noct wheeze disturbs sleep.  can't tell if dulera helpiing at this point not really using it. Dx gold III/IV copd  rec dulera 100 2bid / prn saba  Stop smoking   08/31/2015 1st EPIC  [facility] office visit//    On symbicort 160 poor hfa  Chief Complaint  Patient presents with   Pulmonary Consult    Referred by Dr. Sherryll Burger. Pt seen here in the past- last in 2012. He states that his breathing has been worse over the past few months. He states that he gets SOB walking "not far" and also when he gets upset about something.   wakes up coughing/ congested each am/bad days  can't walk across house s sob/other days does ok very slow to mailbox slt up hill back to house. Sob at rest when gets upset / some better with saba hfa and neb  rec Plan A = Automatic = Symbicort 160 Take 2 puffs first thing in am and then another 2 puffs about 12 hours later.  Spiriva 2 pffs each am (like high octane)  Plan B = Backup Only use your albuterol (proair) as a rescue medication  Plan C = Crisis - only use your albuterol nebulizer if you first try Plan B and it fails  Think of the e cigs as a one way bridge off all tobacco products     02/2020 COVID 19 infection      02/20/2022  f/u ov/[place] office/ re: GOLD 3 copd maint  on advair 250  Chief Complaint  Patient presents with   Follow-up    Having issues with breathing   Dyspnea:  very little activity / unsure of meds Cough: none  Sleeping: on right side down/ flat bed lots of pillow  SABA use: too much, never prechallenges  02: has POC doesn't know how  to use  Rec Plan A = Automatic = Always=    Trelegy 100 one click each am take 2 good drags Plan B = Backup (to supplement plan A, not to replace it) Only use your albuterol inhaler as a rescue medication  Plan C = Crisis (instead of Plan B but only if Plan B stops working) - only use your albuterol nebulizer if you first try Plan B  Please schedule a follow up office visit in 6 weeks, call sooner if needed with all medications /inhalers/ solutions in hand  Add referred again for LDSCT    04/04/2022  f/u ov/[place] office/ re: GOLD 3  maint on breztri   Chief Complaint  Patient presents with   Follow-up    Feels breathing is worse than last ov.  Declined walk  due to back pain   Dyspnea:  limited mostly by cane but better on breztri with less need for albuterol  Cough: not productive/ mostly daytime  Sleeping: R side down/ lots of pillows under head SABA use: never prechallenges  02: none  Covid status: vax all but the lat  Lung cancer screening: try again today  Rec Continue Breztri but just 2 puffs in am and the other 2 pufs 12 h later  are optional  Only use your albuterol as a rescue medication  Also  Ok to try albuterol 15 min before an activity (on alternating days)  that you know would usually make you short of breath   08/10/2022  f/u ov/ office/ re: GOLD 3  maint on Breztri when he can use it  but just taking one bid  Chief Complaint  Patient presents with   Follow-up    SOB with exertion and wheezing.   Dyspnea:  worse since cough flared Cough: dry daytime with "grinding" upper airway quality/ throat clearing - worsened since stopped gabapentin assoc with wheezing   Sleeping: flat bed / lots of pillows less cough noct  SABA use: not helping  02: none  Rec Plan A = Automatic = Always=    Breztri Take 2 puffs first thing in am and then another 2 puffs about 12 hours later.  Work on inhaler technique:  Plan B = Backup (to supplement plan A, not to replace it) Only use your albuterol inhaler as a rescue medication  Plan C = Crisis (instead of Plan B but only if Plan B stops working) - only use your albuterol nebulizer if you first try Plan B Gabapentin 300 mg one half twice daily Pantoprazole (protonix) 40 mg   Take  30-60 min before first meal of the day and Pepcid (famotidine)  20 mg after supper until return to office GERD diet reviewed, bed blocks rec  Depomedrol 120 mg IM  Please schedule a follow up office visit in 4 weeks, sooner if needed  with all medications /inhalers/ solutions in hand     Allergy screen 08/10/22 >  Eos 0. 1/  IgE  1826> referred to allergy      10/15/2022  f/u ov/ office/ re: *** maint on *** did *** bring meds  No chief complaint on file.   Dyspnea:  *** Cough: *** Sleeping: *** SABA use: *** 02: *** Covid status: *** Lung cancer screening: ***   No obvious day to day or daytime variability or assoc excess/ purulent sputum or mucus plugs or hemoptysis or cp or chest tightness, subjective wheeze or overt sinus or hb symptoms.   *** without nocturnal  or early am exacerbation  of respiratory  c/o's or need for noct saba. Also denies any obvious fluctuation of symptoms with weather or environmental changes or other aggravating or alleviating factors except as outlined above   No unusual exposure hx or h/o childhood pna/ asthma or knowledge of premature birth.  Current Allergies, Complete Past Medical History, Past Surgical History, Family History, and Social History were reviewed in [employer] record.  ROS  The following are not active complaints unless bolded Hoarseness, sore  throat, dysphagia, dental problems, itching, sneezing,  nasal congestion or discharge of excess mucus or purulent secretions, ear ache,   fever, chills, sweats, unintended wt loss or wt gain, classically pleuritic or exertional cp,  orthopnea pnd or arm/hand swelling  or leg swelling, presyncope, palpitations, abdominal pain, anorexia, nausea, vomiting,  diarrhea  or change in bowel habits or change in bladder habits, change in stools or change in urine, dysuria, hematuria,  rash, arthralgias, visual complaints, headache, numbness, weakness or ataxia or problems with walking or coordination,  change in mood or  memory.        No outpatient medications have been marked as taking for the 10/15/22 encounter (Appointment) with Nyoka Cowden, MD.                  Objective:   Physical Exam   Wts  10/15/2022        ***  08/10/2022        203  04/04/2022          218  02/20/2022      220  03/01/2021     218  11/30/2020       215  01/21/2020     218  12/11/2019       215  10/23/2019       215   09/28/15 198 lb (89.812 kg)  08/31/15 197 lb (89.359 kg)  04/28/10 200 lb 8 oz (90.946 kg)    Vital signs reviewed  10/15/2022  - Note at rest 02 sats  ***% on ***   General appearance:    ***  Mod bar***              Assessment:

## 2022-10-15 ENCOUNTER — Ambulatory Visit: Payer: Self-pay | Admitting: Internal Medicine

## 2022-10-15 ENCOUNTER — Ambulatory Visit: Payer: Medicare HMO | Admitting: Internal Medicine

## 2022-10-15 DIAGNOSIS — M79601 Pain in right arm: Secondary | ICD-10-CM | POA: Insufficient documentation

## 2022-10-15 DIAGNOSIS — G8929 Other chronic pain: Secondary | ICD-10-CM | POA: Insufficient documentation

## 2022-10-15 DIAGNOSIS — R202 Paresthesia of skin: Secondary | ICD-10-CM | POA: Insufficient documentation

## 2022-10-15 DIAGNOSIS — G5600 Carpal tunnel syndrome, unspecified upper limb: Secondary | ICD-10-CM | POA: Insufficient documentation

## 2022-10-15 DIAGNOSIS — G5620 Lesion of ulnar nerve, unspecified upper limb: Secondary | ICD-10-CM | POA: Insufficient documentation

## 2022-10-15 DIAGNOSIS — Z9889 Other specified postprocedural states: Secondary | ICD-10-CM | POA: Insufficient documentation

## 2022-10-26 DIAGNOSIS — E6609 Other obesity due to excess calories: Secondary | ICD-10-CM | POA: Diagnosis not present

## 2022-10-26 DIAGNOSIS — M5416 Radiculopathy, lumbar region: Secondary | ICD-10-CM | POA: Diagnosis not present

## 2022-10-26 DIAGNOSIS — M792 Neuralgia and neuritis, unspecified: Secondary | ICD-10-CM | POA: Diagnosis not present

## 2022-10-26 DIAGNOSIS — Z79899 Other long term (current) drug therapy: Secondary | ICD-10-CM | POA: Diagnosis not present

## 2022-10-26 DIAGNOSIS — Z1159 Encounter for screening for other viral diseases: Secondary | ICD-10-CM | POA: Diagnosis not present

## 2022-10-26 DIAGNOSIS — Z6831 Body mass index (BMI) 31.0-31.9, adult: Secondary | ICD-10-CM | POA: Diagnosis not present

## 2022-10-26 DIAGNOSIS — E559 Vitamin D deficiency, unspecified: Secondary | ICD-10-CM | POA: Diagnosis not present

## 2022-10-26 DIAGNOSIS — M129 Arthropathy, unspecified: Secondary | ICD-10-CM | POA: Diagnosis not present

## 2022-10-27 LAB — LAB REPORT - SCANNED
Calcium: 9.5
EGFR: 64
HM Hepatitis Screen: NEGATIVE
PTH: 112

## 2022-10-31 DIAGNOSIS — M069 Rheumatoid arthritis, unspecified: Secondary | ICD-10-CM | POA: Diagnosis not present

## 2022-10-31 DIAGNOSIS — Z79899 Other long term (current) drug therapy: Secondary | ICD-10-CM | POA: Diagnosis not present

## 2022-10-31 DIAGNOSIS — R700 Elevated erythrocyte sedimentation rate: Secondary | ICD-10-CM | POA: Diagnosis not present

## 2022-10-31 DIAGNOSIS — R768 Other specified abnormal immunological findings in serum: Secondary | ICD-10-CM | POA: Diagnosis not present

## 2022-10-31 DIAGNOSIS — E6609 Other obesity due to excess calories: Secondary | ICD-10-CM | POA: Diagnosis not present

## 2022-10-31 DIAGNOSIS — M792 Neuralgia and neuritis, unspecified: Secondary | ICD-10-CM | POA: Diagnosis not present

## 2022-10-31 DIAGNOSIS — N1832 Chronic kidney disease, stage 3b: Secondary | ICD-10-CM | POA: Diagnosis not present

## 2022-10-31 DIAGNOSIS — Z6832 Body mass index (BMI) 32.0-32.9, adult: Secondary | ICD-10-CM | POA: Diagnosis not present

## 2022-10-31 DIAGNOSIS — M5416 Radiculopathy, lumbar region: Secondary | ICD-10-CM | POA: Diagnosis not present

## 2022-11-02 DIAGNOSIS — I10 Essential (primary) hypertension: Secondary | ICD-10-CM | POA: Diagnosis not present

## 2022-11-02 DIAGNOSIS — Z299 Encounter for prophylactic measures, unspecified: Secondary | ICD-10-CM | POA: Diagnosis not present

## 2022-11-02 DIAGNOSIS — I723 Aneurysm of iliac artery: Secondary | ICD-10-CM | POA: Diagnosis not present

## 2022-11-02 DIAGNOSIS — I700 Atherosclerosis of aorta: Secondary | ICD-10-CM | POA: Diagnosis not present

## 2022-11-02 DIAGNOSIS — Z0000 Encounter for general adult medical examination without abnormal findings: Secondary | ICD-10-CM | POA: Diagnosis not present

## 2022-11-02 DIAGNOSIS — J449 Chronic obstructive pulmonary disease, unspecified: Secondary | ICD-10-CM | POA: Diagnosis not present

## 2022-11-04 NOTE — Progress Notes (Unsigned)
Subjective:     Patient ID: Sean Ramos, male   DOB: March 19, 1956   MRN: [id number]    Brief patient profile:  49 yobm quit smoking 09/2015 worked as Art gallery manager for power plants in [place]  And s/p RLLobectomy ? Age 67 ? Dx ? And reports  flunked pfts for job around 2000 and on disability and since then  dx as copd in [place] 2011 p seen in pulmonary clinic 04/28/10 and referred back by Dr Franchot Mimes 08/31/2015  With gold III cpd criteria in Jan 2012     History of Present Illness  April 28, 2010  1st pulmonary office eval ov cc doe and cough onset in 40's and progressed to point where doe shopping leans on cart and struggles to get groceries back to his house (lives next to store). has noct wheeze disturbs sleep.  can't tell if dulera helpiing at this point not really using it. Dx gold III/IV copd  rec dulera 100 2bid / prn saba  Stop smoking   08/31/2015 1st EPIC   Pulmonary office visit//    On symbicort 160 poor hfa  Chief Complaint  Patient presents with   Pulmonary Consult    Referred by Dr. Sherryll Burger. Pt seen here in the past- last in 2012. He states that his breathing has been worse over the past few months. He states that he gets SOB walking "not far" and also when he gets upset about something.   wakes up coughing/ congested each am/bad days  can't walk across house s sob/other days does ok very slow to mailbox slt up hill back to house. Sob at rest when gets upset / some better with saba hfa and neb  rec Plan A = Automatic = Symbicort 160 Take 2 puffs first thing in am and then another 2 puffs about 12 hours later.  Spiriva 2 pffs each am (like high octane)  Plan B = Backup Only use your albuterol (proair) as a rescue medication  Plan C = Crisis - only use your albuterol nebulizer if you first try Plan B and it fails  Think of the e cigs as a one way bridge off all tobacco products     02/2020 COVID 19 infection      02/20/2022  f/u ov/[place] office/ re: GOLD 3 copd maint  on advair 250  Chief Complaint  Patient presents with   Follow-up    Having issues with breathing   Dyspnea:  very little activity / unsure of meds Cough: none  Sleeping: on right side down/ flat bed lots of pillow  SABA use: too much, never prechallenges  02: has POC doesn't know how  to use  Rec Plan A = Automatic = Always=    Trelegy 100 one click each am take 2 good drags Plan B = Backup (to supplement plan A, not to replace it) Only use your albuterol inhaler as a rescue medication  Plan C = Crisis (instead of Plan B but only if Plan B stops working) - only use your albuterol nebulizer if you first try Plan B  Please schedule a follow up office visit in 6 weeks, call sooner if needed with all medications /inhalers/ solutions in hand  Add referred again for LDSCT    04/04/2022  f/u ov/[place] office/ re: GOLD 3  maint on breztri   Chief Complaint  Patient presents with   Follow-up    Feels breathing is worse than last ov.  Declined walk due  to back pain   Dyspnea:  limited mostly by cane but better on breztri with less need for albuterol  Cough: not productive/ mostly daytime  Sleeping: R side down/ lots of pillows under head SABA use: never prechallenges  02: none  Covid status: vax all but the lat  Lung cancer screening: try again today  Rec Continue Breztri but just 2 puffs in am and the other 2 pufs 12 h later  are optional  Only use your albuterol as a rescue medication  Also  Ok to try albuterol 15 min before an activity (on alternating days)  that you know would usually make you short of breath   08/10/2022  f/u ov/[place] office/ re: GOLD 3  maint on Breztri when he can use it  but just taking one bid  Chief Complaint  Patient presents with   Follow-up    SOB with exertion and wheezing.   Dyspnea:  worse since cough flared Cough: dry daytime with "grinding" upper airway quality/ throat clearing - worsened since stopped gabapentin assoc with wheezing   Sleeping: flat bed / lots of pillows less cough noct  SABA use: not helping  02: none  Rec Plan A = Automatic = Always=    Breztri Take 2 puffs first thing in am and then another 2 puffs about 12 hours later.  Work on inhaler technique:  Plan B = Backup (to supplement plan A, not to replace it) Only use your albuterol inhaler as a rescue medication  Plan C = Crisis (instead of Plan B but only if Plan B stops working) - only use your albuterol nebulizer if you first try Plan B Gabapentin 300 mg one half twice daily Pantoprazole (protonix) 40 mg   Take  30-60 min before first meal of the day and Pepcid (famotidine)  20 mg after supper until return to office GERD diet reviewed, bed blocks rec  Depomedrol 120 mg IM  Please schedule a follow up office visit in 4 weeks, sooner if needed  with all medications /inhalers/ solutions in hand     Allergy screen 08/10/22 >  Eos 0. 1/  IgE  1826> referred to allergy      11/05/2022  f/u ov/ office/ re: *** maint on *** did *** bring meds  No chief complaint on file.   Dyspnea:  *** Cough: *** Sleeping: *** SABA use: *** 02: *** Covid status: *** Lung cancer screening: ***   No obvious day to day or daytime variability or assoc excess/ purulent sputum or mucus plugs or hemoptysis or cp or chest tightness, subjective wheeze or overt sinus or hb symptoms.   *** without nocturnal  or early am exacerbation  of respiratory  c/o's or need for noct saba. Also denies any obvious fluctuation of symptoms with weather or environmental changes or other aggravating or alleviating factors except as outlined above   No unusual exposure hx or h/o childhood pna/ asthma or knowledge of premature birth.  Current Allergies, Complete Past Medical History, Past Surgical History, Family History, and Social History were reviewed in [employer] record.  ROS  The following are not active complaints unless bolded Hoarseness, sore  throat, dysphagia, dental problems, itching, sneezing,  nasal congestion or discharge of excess mucus or purulent secretions, ear ache,   fever, chills, sweats, unintended wt loss or wt gain, classically pleuritic or exertional cp,  orthopnea pnd or arm/hand swelling  or leg swelling, presyncope, palpitations, abdominal pain, anorexia, nausea, vomiting, diarrhea  or change in bowel habits or change in bladder habits, change in stools or change in urine, dysuria, hematuria,  rash, arthralgias, visual complaints, headache, numbness, weakness or ataxia or problems with walking or coordination,  change in mood or  memory.        No outpatient medications have been marked as taking for the 11/05/22 encounter (Appointment) with Nyoka Cowden, MD.                  Objective:   Physical Exam   Wts  11/05/2022        ***  08/10/2022        203  04/04/2022          218  02/20/2022      220  03/01/2021     218  11/30/2020       215  01/21/2020     218  12/11/2019       215  10/23/2019       215   09/28/15 198 lb (89.812 kg)  08/31/15 197 lb (89.359 kg)  04/28/10 200 lb 8 oz (90.946 kg)    Vital signs reviewed  11/05/2022  - Note at rest 02 sats  ***% on ***   General appearance:    ***  Mod bar***              Assessment:

## 2022-11-05 ENCOUNTER — Ambulatory Visit: Payer: Medicare HMO | Admitting: Internal Medicine

## 2022-11-05 ENCOUNTER — Encounter: Payer: Self-pay | Admitting: Internal Medicine

## 2022-11-05 VITALS — BP 115/68 | HR 108 | Ht 66.5 in | Wt 196.0 lb

## 2022-11-05 DIAGNOSIS — J449 Chronic obstructive pulmonary disease, unspecified: Secondary | ICD-10-CM

## 2022-11-05 DIAGNOSIS — F17210 Nicotine dependence, cigarettes, uncomplicated: Secondary | ICD-10-CM | POA: Diagnosis not present

## 2022-11-05 DIAGNOSIS — R058 Other specified cough: Secondary | ICD-10-CM

## 2022-11-05 MED ORDER — BREZTRI AEROSPHERE 160-9-4.8 MCG/ACT IN AERO: INHALATION_SPRAY | RESPIRATORY_TRACT | 11 refills | Status: AC

## 2022-11-05 NOTE — Patient Instructions (Addendum)
Plan A = Automatic = Always=    Breztri 2 puffs 1st thing in am then repeat 12 hours later optional  Work on inhaler technique:  relax and gently blow all the way out then take a nice smooth full deep breath back in, triggering the inhaler at same time you start breathing in.  Hold breath in for at least  5 seconds if you can. Blow out breztri thru nose. Rinse and gargle with water when done.  If mouth or throat bother you at all,  try brushing teeth/gums/tongue with arm and hammer toothpaste/ make a slurry and gargle and spit out.   See [employer] paperwork to determine  if your qualify for a free supply of breztri from the company  Plan B = Backup (to supplement plan A, not to replace it) Only use your albuterol inhaler as a rescue medication to be used if you can't catch your breath by resting or doing a relaxed purse lip breathing pattern.  - The less you use it, the better it will work when you need it. - Ok to use the inhaler up to 2 puffs  every 4 hours if you must but call for appointment if use goes up over your usual need - Don't leave home without it !!  (think of it like the spare tire for your car)   Plan C = Crisis (instead of Plan B but only if Plan B stops working) - only use your albuterol nebulizer if you first try Plan B and it fails to help > ok to use the nebulizer up to every 4 hours but if start needing it regularly call for immediate appointment   Please schedule a follow up visit in 6  months but call sooner if needed

## 2022-11-05 NOTE — Assessment & Plan Note (Signed)
Quit smoking 09/2015 PFT's April 28, 2010  FEV1 1.03 (33%) ratio 45 and no better p B2,  DLC0 65% - Stopped working Apr 21 2015  - 08/31/2015   try add spiriva resp to sym 160  - 08/31/2015   Walked RA x one lap @ 185 stopped due to  Sob, nl pace, no desat  PFT's  09/02/15 FEV1 0.88(32%) ratio 43 p 15% improvement from saba p symbicort 160 / spiriva prior ith DLCO  38 corrects to 81 for alv volume   - 09/05/2015 completed disability paperwork  - 09/28/2015 completed additional disability paperwork  - 10/23/2019  After extensive coaching inhaler device,  effectiveness =    50% > change breo to breztri  - PFT's 11/25/19   FEV1 0.81 (31 % ) ratio 0.51  p 0 % improvement from saba p  "3 inhalers"   prior to study with DLCO  13.48 (56%) corrects to 4.35 (105%)  for alv volume and FV curve classic severe concavity   -  12/11/2019   Walked RA  approx   400 ft  @ moderat pace  stopped due to  End of study c/o sob with sats still    Feet with sats still 96%  - 02/20/2022   Walked on RA  x  2  lap(s) =  approx 300 ft  @ slow pace, stopped due to sob/back pain  with lowest 02 sats 93%   - 04/04/2022   continue breztri 2 each am to reduce cost with pm doses prn  - 11/05/2022  After extensive coaching inhaler device,  effectiveness =   75% (short ti)    Group D (now reclassified as E) in terms of symptom/risk and laba/lama/ICS  therefore appropriate rx at this point >>>  continue breztri and approp saba   On prednisone floor of 5 mg pending rheum eval

## 2022-11-05 NOTE — Assessment & Plan Note (Signed)
Stopped 09/2015 but still using e cigs as of 10/23/2019  - LDSCT done q April since 2024 RADS 1 baseline  Urged to use e cigs to cross the one way bridge off all nicotine sources  Reviewed findings of LDSCT   F/u can be q  6 m, sooner prn         Each maintenance medication was reviewed in detail including emphasizing most importantly the difference between maintenance and prns and under what circumstances the prns are to be triggered using an action plan format where appropriate.  Total time for H and P, chart review, counseling, reviewing hfa device(s) and generating customized AVS unique to this office visit / same day charting = 33 min

## 2022-11-05 NOTE — Assessment & Plan Note (Signed)
Flared spring of 2024 w/in weeks of stopping gabapentin - Gerd rx 08/10/2022 and gabapentin titrated to max of 1200 mg daily  - Allergy screen 08/10/22 >  Eos 0. 1/  IgE  1826> referred to allergy   Improved for now but needs to complete allergy eval to identify/ avoid potential triggers once they are identified.

## 2022-11-07 ENCOUNTER — Other Ambulatory Visit: Payer: Self-pay | Admitting: Internal Medicine

## 2022-11-21 ENCOUNTER — Ambulatory Visit: Payer: Medicare HMO | Admitting: Allergy & Immunology

## 2022-11-29 DIAGNOSIS — Z6832 Body mass index (BMI) 32.0-32.9, adult: Secondary | ICD-10-CM | POA: Diagnosis not present

## 2022-11-29 DIAGNOSIS — R768 Other specified abnormal immunological findings in serum: Secondary | ICD-10-CM | POA: Diagnosis not present

## 2022-11-29 DIAGNOSIS — M069 Rheumatoid arthritis, unspecified: Secondary | ICD-10-CM | POA: Diagnosis not present

## 2022-11-29 DIAGNOSIS — M5416 Radiculopathy, lumbar region: Secondary | ICD-10-CM | POA: Diagnosis not present

## 2022-11-29 DIAGNOSIS — R700 Elevated erythrocyte sedimentation rate: Secondary | ICD-10-CM | POA: Diagnosis not present

## 2022-11-29 DIAGNOSIS — M792 Neuralgia and neuritis, unspecified: Secondary | ICD-10-CM | POA: Diagnosis not present

## 2022-11-29 DIAGNOSIS — Z79899 Other long term (current) drug therapy: Secondary | ICD-10-CM | POA: Diagnosis not present

## 2022-11-29 DIAGNOSIS — N1832 Chronic kidney disease, stage 3b: Secondary | ICD-10-CM | POA: Diagnosis not present

## 2022-12-04 DIAGNOSIS — Z79899 Other long term (current) drug therapy: Secondary | ICD-10-CM | POA: Diagnosis not present

## 2022-12-20 ENCOUNTER — Ambulatory Visit: Payer: Medicare HMO | Admitting: Allergy

## 2022-12-30 DIAGNOSIS — Z6832 Body mass index (BMI) 32.0-32.9, adult: Secondary | ICD-10-CM | POA: Diagnosis not present

## 2022-12-30 DIAGNOSIS — M792 Neuralgia and neuritis, unspecified: Secondary | ICD-10-CM | POA: Diagnosis not present

## 2022-12-30 DIAGNOSIS — M069 Rheumatoid arthritis, unspecified: Secondary | ICD-10-CM | POA: Diagnosis not present

## 2022-12-30 DIAGNOSIS — N1832 Chronic kidney disease, stage 3b: Secondary | ICD-10-CM | POA: Diagnosis not present

## 2022-12-30 DIAGNOSIS — R700 Elevated erythrocyte sedimentation rate: Secondary | ICD-10-CM | POA: Diagnosis not present

## 2022-12-30 DIAGNOSIS — R768 Other specified abnormal immunological findings in serum: Secondary | ICD-10-CM | POA: Diagnosis not present

## 2022-12-30 DIAGNOSIS — Z79899 Other long term (current) drug therapy: Secondary | ICD-10-CM | POA: Diagnosis not present

## 2022-12-30 DIAGNOSIS — M5416 Radiculopathy, lumbar region: Secondary | ICD-10-CM | POA: Diagnosis not present

## 2022-12-30 DIAGNOSIS — Z6830 Body mass index (BMI) 30.0-30.9, adult: Secondary | ICD-10-CM | POA: Diagnosis not present

## 2023-01-14 ENCOUNTER — Ambulatory Visit: Payer: Medicare HMO | Admitting: Internal Medicine

## 2023-01-25 DIAGNOSIS — M069 Rheumatoid arthritis, unspecified: Secondary | ICD-10-CM | POA: Diagnosis not present

## 2023-01-25 DIAGNOSIS — N1832 Chronic kidney disease, stage 3b: Secondary | ICD-10-CM | POA: Diagnosis not present

## 2023-01-25 DIAGNOSIS — M5416 Radiculopathy, lumbar region: Secondary | ICD-10-CM | POA: Diagnosis not present

## 2023-01-25 DIAGNOSIS — Z6829 Body mass index (BMI) 29.0-29.9, adult: Secondary | ICD-10-CM | POA: Diagnosis not present

## 2023-01-25 DIAGNOSIS — E7800 Pure hypercholesterolemia, unspecified: Secondary | ICD-10-CM | POA: Diagnosis not present

## 2023-01-25 DIAGNOSIS — M792 Neuralgia and neuritis, unspecified: Secondary | ICD-10-CM | POA: Diagnosis not present

## 2023-02-25 ENCOUNTER — Ambulatory Visit: Payer: Medicare HMO | Admitting: Internal Medicine

## 2023-02-25 DIAGNOSIS — Z79899 Other long term (current) drug therapy: Secondary | ICD-10-CM | POA: Diagnosis not present

## 2023-02-25 DIAGNOSIS — M5416 Radiculopathy, lumbar region: Secondary | ICD-10-CM | POA: Diagnosis not present

## 2023-02-25 DIAGNOSIS — M549 Dorsalgia, unspecified: Secondary | ICD-10-CM | POA: Diagnosis not present

## 2023-02-25 DIAGNOSIS — Z6829 Body mass index (BMI) 29.0-29.9, adult: Secondary | ICD-10-CM | POA: Diagnosis not present

## 2023-03-23 IMAGING — CR DG LUMBAR SPINE 1V
1 series · 1 of 1 positions shown · non-contrast
Comparison: Preoperative exam 05/30/2020 demonstrating 4
non-rib-bearing lumbar vertebra. MRI 06/17/2020

CLINICAL DATA: Intraop lumbar surgery for instrument placement.

EXAM:
LUMBAR SPINE - 1 VIEW

[lateral]
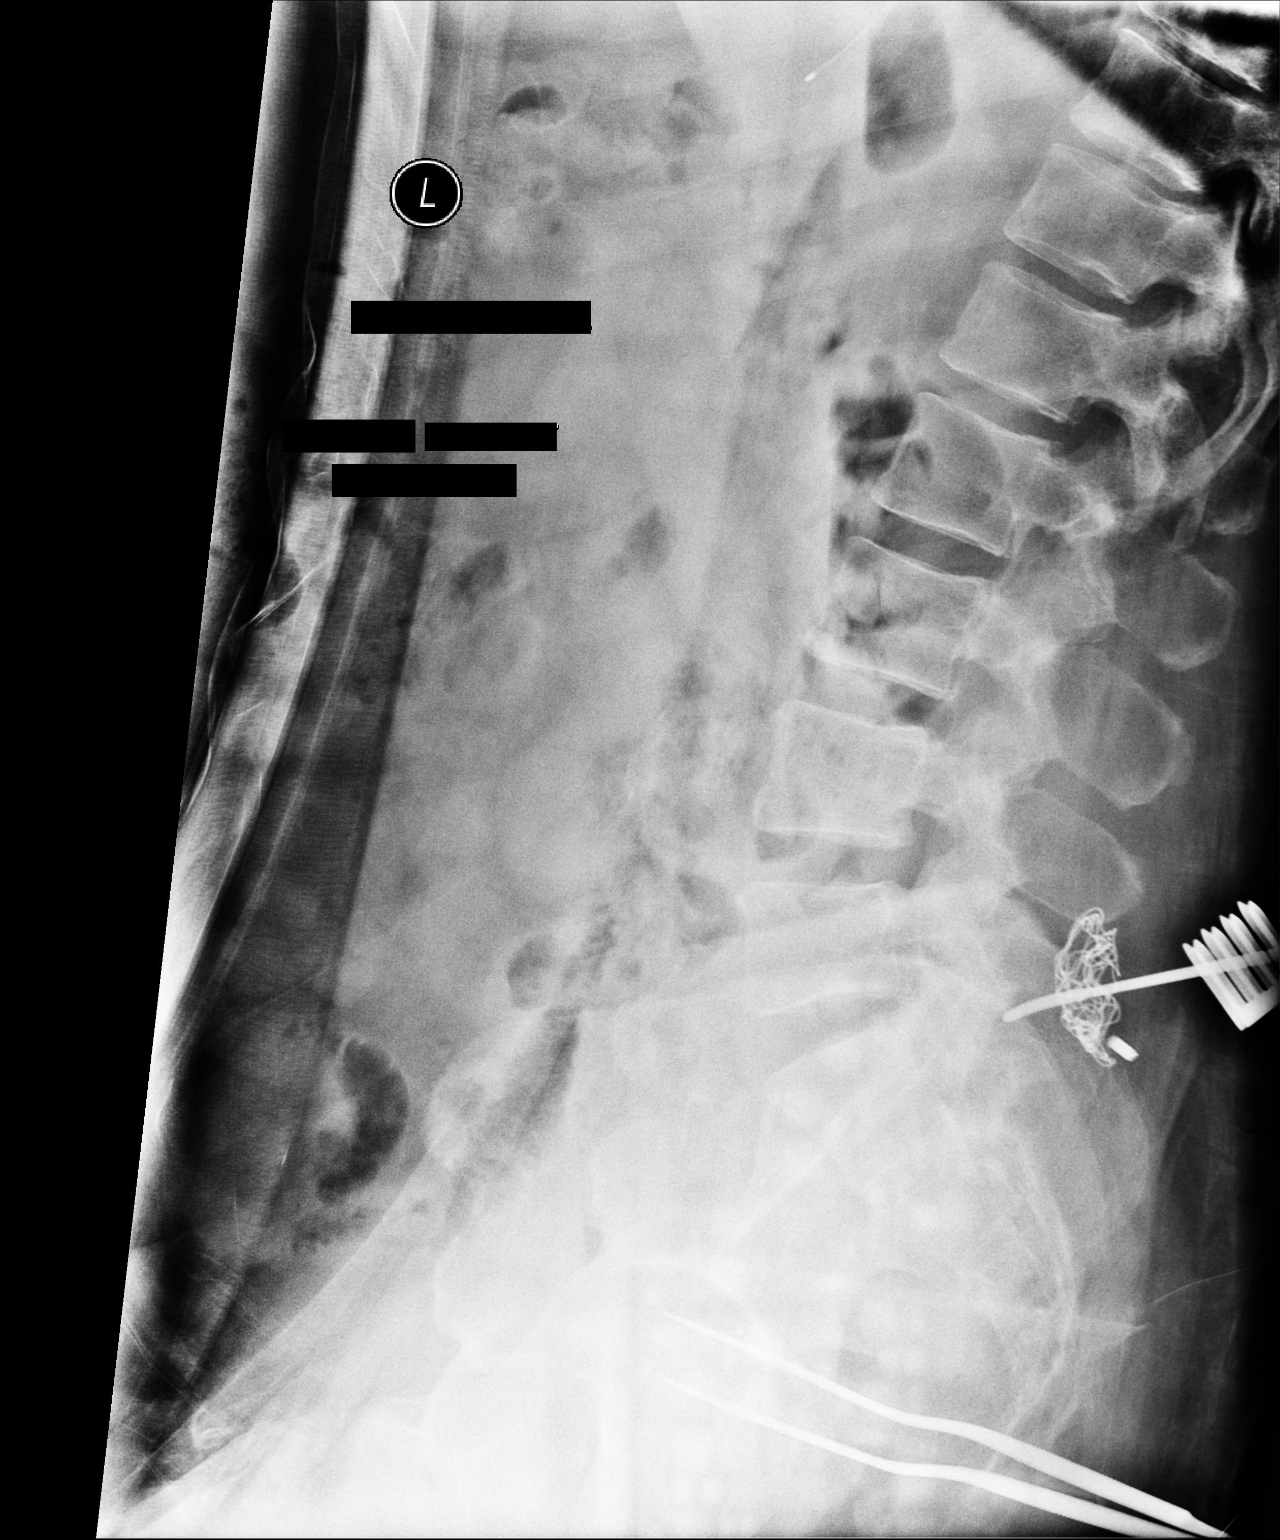

[1 of 1 positions shown; findings below may reference images not displayed]

FINDINGS: Portable cross-table lateral view of the lumbar spine obtained in
the operating room. The lower most non-rib-bearing lumbar vertebra
will be labeled L5, same numbering technique as prior MRI. Surgical
instrument localizes posterior to L5-S1 disc space.
IMPRESSION: Surgical instrument localizes to the L5-S1 disc space.

## 2023-03-25 DIAGNOSIS — M792 Neuralgia and neuritis, unspecified: Secondary | ICD-10-CM | POA: Diagnosis not present

## 2023-03-25 DIAGNOSIS — N1832 Chronic kidney disease, stage 3b: Secondary | ICD-10-CM | POA: Diagnosis not present

## 2023-03-25 DIAGNOSIS — Z6829 Body mass index (BMI) 29.0-29.9, adult: Secondary | ICD-10-CM | POA: Diagnosis not present

## 2023-03-25 DIAGNOSIS — M5416 Radiculopathy, lumbar region: Secondary | ICD-10-CM | POA: Diagnosis not present

## 2023-03-25 DIAGNOSIS — M419 Scoliosis, unspecified: Secondary | ICD-10-CM | POA: Diagnosis not present

## 2023-03-25 DIAGNOSIS — R030 Elevated blood-pressure reading, without diagnosis of hypertension: Secondary | ICD-10-CM | POA: Diagnosis not present

## 2023-03-25 DIAGNOSIS — I10 Essential (primary) hypertension: Secondary | ICD-10-CM | POA: Diagnosis not present

## 2023-03-25 DIAGNOSIS — Z79899 Other long term (current) drug therapy: Secondary | ICD-10-CM | POA: Diagnosis not present

## 2023-04-01 ENCOUNTER — Other Ambulatory Visit: Payer: Self-pay

## 2023-04-01 ENCOUNTER — Ambulatory Visit (INDEPENDENT_AMBULATORY_CARE_PROVIDER_SITE_OTHER): Payer: Medicare HMO | Admitting: Internal Medicine

## 2023-04-01 ENCOUNTER — Encounter: Payer: Self-pay | Admitting: Internal Medicine

## 2023-04-01 VITALS — BP 126/74 | HR 113 | Temp 97.9°F | Resp 20 | Ht 65.35 in | Wt 184.6 lb

## 2023-04-01 DIAGNOSIS — J3089 Other allergic rhinitis: Secondary | ICD-10-CM

## 2023-04-01 DIAGNOSIS — J438 Other emphysema: Secondary | ICD-10-CM | POA: Diagnosis not present

## 2023-04-01 DIAGNOSIS — R768 Other specified abnormal immunological findings in serum: Secondary | ICD-10-CM | POA: Diagnosis not present

## 2023-04-01 MED ORDER — FLUTICASONE PROPIONATE 50 MCG/ACT NA SUSP: 2.0000 | Freq: Every day | NASAL | 5 refills | Status: AC

## 2023-04-01 NOTE — Progress Notes (Signed)
NEW PATIENT  Date of Service/Encounter:  04/01/23  Consult requested by: Kirstie Peri, MD   Subjective:   Sean Ramos (DOB: [birth year]) is a 67 y.o. male who presents to the clinic on 04/01/2023 with a chief complaint of New Patient (Initial Visit) (Allergy test) .    History obtained from: chart review and patient.   Rhinitis:  Started around childhood.   Symptoms include: nasal congestion, rhinorrhea, and post nasal drainage, itchy runny eyes   Occurs year-round Potential triggers: not sure  Treatments tried:  PRN oral anti histamines OTC  Previous allergy testing: no History of sinus surgery: none Nonallergic triggers: none   COPD: Followed by Dr Sherene Sires.  On Breztri samples.  Has been on and off on this due to trouble with affording it. Using Albuterol almost daily due to dyspnea. Still vaping.    Reviewed:  11/05/2022: seen by Dr Sherene Sires for COPD. 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  Having trouble with dyspnea.  Switched from [place] to [place]. Referred to Allergy for rhinitis.   PFT 11/25/2019: self interpreted- obstruction by ratio, severe by FEV1 no reversibility. Low diffusion capacity.  Normal TLC.   08/10/2022: IgE 1826, AEC 100  11/17/2021: AEC 200   08/03/2022:  CT Chest Prior right lower lobectomy. Remaining central airways are patent. Moderate to severe centrilobular and paraseptal emphysema, most pronounced in the right upper lobe. No consolidation, pleural effusion or pneumothorax. No suspicious pulmonary nodules Past Medical History: Past Medical History:  Diagnosis Date   Anxiety    Arthritis    Asthma    Cancer (HCC)    skin cancer on right thumb   COPD (chronic obstructive pulmonary disease) (HCC)    Depression    Hyperlipidemia    Hypertension    Insomnia    Pneumonia    1975   Stroke [facility])     Past Surgical History: Past Surgical History:  Procedure Laterality Date   APPENDECTOMY     EYE SURGERY     cataracts  bilateral   LUMBAR LAMINECTOMY/DECOMPRESSION MICRODISCECTOMY N/A 09/05/2020   Procedure: LAMINECTOMY LUMBAR FIVE  - SACRAL ONE;  Surgeon: Tressie Stalker, MD;  Location: [facility] OR;  Service: Neurosurgery;  Laterality: N/A;   MOHS SURGERY Right 2002   rt hand    Family History: Family History  Problem Relation Age of Onset   Heart attack Father    Stroke Brother    Stroke Brother     Social History:  Flooring in bedroom: wood Pets: dog Tobacco use/exposure: vaping  Medication List:  Allergies as of 04/01/2023   No Known Allergies      Medication List        Accurate as of April 01, 2023 11:41 AM. If you have any questions, ask your nurse or doctor.          albuterol 108 (90 Base) MCG/ACT inhaler Commonly known as: VENTOLIN HFA Inhale 2 puffs into the lungs every 6 (six) hours as needed for wheezing or shortness of breath.   aspirin 81 MG tablet Take 81 mg by mouth daily.   Breztri Aerosphere 160-9-4.8 MCG/ACT Aero Generic drug: Budeson-Glycopyrrol-Formoterol Take 2 puffs first thing in am and then another 2 puffs about 12 hours later.   clonazePAM 0.5 MG tablet Commonly known as: KLONOPIN Take 0.5 mg by mouth 3 (three) times daily as needed for anxiety.   famotidine 20 MG tablet Commonly known as: Pepcid One after supper   furosemide 20 MG  tablet Commonly known as: LASIX Take 20 mg by mouth daily.   hydrochlorothiazide 25 MG tablet Commonly known as: HYDRODIURIL Take 25 mg by mouth daily.   HYDROcodone-acetaminophen 5-325 MG tablet Commonly known as: NORCO/VICODIN 1 tablet every 6 (six) hours as needed.   ipratropium-albuterol 0.5-2.5 (3) MG/3ML Soln Commonly known as: DUONEB Take 3 mLs by nebulization every 4 (four) hours as needed (Asthma).   NIFEdipine 90 MG 24 hr tablet Commonly known as: ADALAT CC Take 90 mg by mouth daily.   olmesartan 40 MG tablet Commonly known as: BENICAR Take 40 mg by mouth daily.   oxybutynin 5 MG  tablet Commonly known as: DITROPAN 5 mg every 8 (eight) hours as needed.   pantoprazole 40 MG tablet Commonly known as: PROTONIX TAKE 1 TABLET (40 MG TOTAL) BY MOUTH DAILY. TAKE 30-60 MIN BEFORE FIRST MEAL OF THE DAY   Potassium Chloride ER 20 MEQ Tbcr   predniSONE 5 MG tablet Commonly known as: DELTASONE Take 5 mg by mouth daily.   simvastatin 20 MG tablet Commonly known as: ZOCOR Take 20 mg by mouth daily.   tadalafil 10 MG tablet Commonly known as: CIALIS PLEASE SEE ATTACHED FOR DETAILED DIRECTIONS   terazosin 5 MG capsule Commonly known as: HYTRIN Take 5 mg by mouth at bedtime.   testosterone cypionate 200 MG/ML injection Commonly known as: DEPOTESTOSTERONE CYPIONATE Inject 200 mg into the skin every 28 (twenty-eight) days.   traZODone 150 MG tablet Commonly known as: DESYREL Take 150 mg by mouth at bedtime.   valsartan 320 MG tablet Commonly known as: DIOVAN Take 320 mg by mouth daily.   venlafaxine XR 150 MG 24 hr capsule Commonly known as: EFFEXOR-XR Take 150 mg by mouth daily with breakfast.         REVIEW OF SYSTEMS: Pertinent positives and negatives discussed in HPI.   Objective:   Physical Exam: BP 126/74   Pulse (!) 113   Temp 97.9 F (36.6 C) (Temporal)   Resp 20   Ht 5' 5.35" (1.66 m)   Wt 184 lb 9.6 oz (83.7 kg)   SpO2 93%   BMI 30.39 kg/m  Body mass index is 30.39 kg/m. GEN: alert, well developed HEENT: clear conjunctiva, TM grey and translucent, nose with + mild inferior turbinate hypertrophy, pink nasal mucosa, slight clear rhinorrhea, no cobblestoning HEART: regular rate and rhythm, no murmur LUNGS: clear to auscultation bilaterally, no coughing, unlabored respiration ABDOMEN: soft, non distended  SKIN: no rashes or lesions   Assessment:   1. Other allergic rhinitis   2. Elevated IgE level   3. Other emphysema (HCC)     Plan/Recommendations:  Other Allergic Rhinitis: - Due to turbinate hypertrophy, seasonal symptoms  and unresponsive to over the counter meds, will perform skin testing to identify aeroallergen triggers.   - Use nasal saline rinses before nose sprays such as with Neilmed Sinus Rinse.  Use distilled water.   - Use Flonase 2 sprays each nostril daily. Aim upward and outward. - Hold all anti-histamines (Xyzal, Allegra, Zyrtec, Claritin, Benadryl) 3 days prior to next visit.   Elevated IgE - Discussed this is a non specific marker.  Can be related to atopic dx like allergic rhinitis but also could be elevated due to smoking/COPD.   COPD - Continue to follow up with Dr. Sherene Sires- continue Breztri 160-9-4.52mcg 2 puffs twice daily. Discussed use of maintenance inhaler BID as prescribed while Albuterol is rescue only.    Follow up: 1:30 PM on 05/06/2023 for  skin testing 1-55, IDs okay    Alesia Morin, MD [facility]

## 2023-04-01 NOTE — Patient Instructions (Addendum)
Other Allergic Rhinitis:   - Use nasal saline rinses before nose sprays such as with Neilmed Sinus Rinse.  Use distilled water.   - Use Flonase 2 sprays each nostril daily. Aim upward and outward. - Hold all anti-histamines (Xyzal, Allegra, Zyrtec, Claritin, Benadryl) 3 days prior to next visit.   COPD - Continue to follow up with Dr. Sherene Sires- on [place] 160-9-4.40mcg 2 puffs twice daily.    Follow up: 1:30 PM on 05/06/2023 for skin testing 1-55

## 2023-04-10 ENCOUNTER — Encounter: Payer: Medicare HMO | Admitting: Internal Medicine

## 2023-04-25 DIAGNOSIS — M79605 Pain in left leg: Secondary | ICD-10-CM | POA: Diagnosis not present

## 2023-04-25 DIAGNOSIS — M5416 Radiculopathy, lumbar region: Secondary | ICD-10-CM | POA: Diagnosis not present

## 2023-04-25 DIAGNOSIS — Z79899 Other long term (current) drug therapy: Secondary | ICD-10-CM | POA: Diagnosis not present

## 2023-04-25 DIAGNOSIS — M79604 Pain in right leg: Secondary | ICD-10-CM | POA: Diagnosis not present

## 2023-04-25 DIAGNOSIS — Z6828 Body mass index (BMI) 28.0-28.9, adult: Secondary | ICD-10-CM | POA: Diagnosis not present

## 2023-05-06 ENCOUNTER — Ambulatory Visit: Payer: Medicare HMO | Admitting: Internal Medicine

## 2023-05-13 ENCOUNTER — Ambulatory Visit: Payer: Medicare HMO | Admitting: Internal Medicine

## 2023-05-20 DIAGNOSIS — M79604 Pain in right leg: Secondary | ICD-10-CM | POA: Diagnosis not present

## 2023-05-28 DIAGNOSIS — Z79899 Other long term (current) drug therapy: Secondary | ICD-10-CM | POA: Diagnosis not present

## 2023-05-28 DIAGNOSIS — F32A Depression, unspecified: Secondary | ICD-10-CM | POA: Diagnosis not present

## 2023-05-28 DIAGNOSIS — M5416 Radiculopathy, lumbar region: Secondary | ICD-10-CM | POA: Diagnosis not present

## 2023-05-28 DIAGNOSIS — Z6827 Body mass index (BMI) 27.0-27.9, adult: Secondary | ICD-10-CM | POA: Diagnosis not present

## 2023-05-28 DIAGNOSIS — M79605 Pain in left leg: Secondary | ICD-10-CM | POA: Diagnosis not present

## 2023-05-28 DIAGNOSIS — M79604 Pain in right leg: Secondary | ICD-10-CM | POA: Diagnosis not present

## 2023-05-28 DIAGNOSIS — F419 Anxiety disorder, unspecified: Secondary | ICD-10-CM | POA: Diagnosis not present

## 2023-06-10 ENCOUNTER — Ambulatory Visit: Payer: Medicare HMO | Admitting: Internal Medicine

## 2023-06-27 DIAGNOSIS — I10 Essential (primary) hypertension: Secondary | ICD-10-CM | POA: Diagnosis not present

## 2023-06-27 DIAGNOSIS — Z6827 Body mass index (BMI) 27.0-27.9, adult: Secondary | ICD-10-CM | POA: Diagnosis not present

## 2023-06-27 DIAGNOSIS — Z79899 Other long term (current) drug therapy: Secondary | ICD-10-CM | POA: Diagnosis not present

## 2023-06-27 DIAGNOSIS — E7800 Pure hypercholesterolemia, unspecified: Secondary | ICD-10-CM | POA: Diagnosis not present

## 2023-06-27 DIAGNOSIS — M5416 Radiculopathy, lumbar region: Secondary | ICD-10-CM | POA: Diagnosis not present

## 2023-06-28 DIAGNOSIS — U071 COVID-19: Secondary | ICD-10-CM | POA: Diagnosis not present

## 2023-06-28 DIAGNOSIS — R6889 Other general symptoms and signs: Secondary | ICD-10-CM | POA: Diagnosis not present

## 2023-06-28 DIAGNOSIS — R52 Pain, unspecified: Secondary | ICD-10-CM | POA: Diagnosis not present

## 2023-06-28 DIAGNOSIS — Z299 Encounter for prophylactic measures, unspecified: Secondary | ICD-10-CM | POA: Diagnosis not present

## 2023-07-01 DIAGNOSIS — Z79899 Other long term (current) drug therapy: Secondary | ICD-10-CM | POA: Diagnosis not present

## 2023-07-18 DIAGNOSIS — F1320 Sedative, hypnotic or anxiolytic dependence, uncomplicated: Secondary | ICD-10-CM | POA: Diagnosis not present

## 2023-07-18 DIAGNOSIS — Z7189 Other specified counseling: Secondary | ICD-10-CM | POA: Diagnosis not present

## 2023-07-18 DIAGNOSIS — J449 Chronic obstructive pulmonary disease, unspecified: Secondary | ICD-10-CM | POA: Diagnosis not present

## 2023-07-18 DIAGNOSIS — I723 Aneurysm of iliac artery: Secondary | ICD-10-CM | POA: Diagnosis not present

## 2023-07-18 DIAGNOSIS — Z1331 Encounter for screening for depression: Secondary | ICD-10-CM | POA: Diagnosis not present

## 2023-07-18 DIAGNOSIS — F3342 Major depressive disorder, recurrent, in full remission: Secondary | ICD-10-CM | POA: Diagnosis not present

## 2023-07-18 DIAGNOSIS — Z299 Encounter for prophylactic measures, unspecified: Secondary | ICD-10-CM | POA: Diagnosis not present

## 2023-07-18 DIAGNOSIS — Z0000 Encounter for general adult medical examination without abnormal findings: Secondary | ICD-10-CM | POA: Diagnosis not present

## 2023-07-18 DIAGNOSIS — R52 Pain, unspecified: Secondary | ICD-10-CM | POA: Diagnosis not present

## 2023-07-18 DIAGNOSIS — Z1339 Encounter for screening examination for other mental health and behavioral disorders: Secondary | ICD-10-CM | POA: Diagnosis not present

## 2023-07-18 DIAGNOSIS — I10 Essential (primary) hypertension: Secondary | ICD-10-CM | POA: Diagnosis not present

## 2023-07-25 DIAGNOSIS — Z6829 Body mass index (BMI) 29.0-29.9, adult: Secondary | ICD-10-CM | POA: Diagnosis not present

## 2023-07-25 DIAGNOSIS — Z6827 Body mass index (BMI) 27.0-27.9, adult: Secondary | ICD-10-CM | POA: Diagnosis not present

## 2023-07-25 DIAGNOSIS — I10 Essential (primary) hypertension: Secondary | ICD-10-CM | POA: Diagnosis not present

## 2023-07-25 DIAGNOSIS — Z79899 Other long term (current) drug therapy: Secondary | ICD-10-CM | POA: Diagnosis not present

## 2023-07-25 DIAGNOSIS — R768 Other specified abnormal immunological findings in serum: Secondary | ICD-10-CM | POA: Diagnosis not present

## 2023-07-25 DIAGNOSIS — M5416 Radiculopathy, lumbar region: Secondary | ICD-10-CM | POA: Diagnosis not present

## 2023-07-25 DIAGNOSIS — E7800 Pure hypercholesterolemia, unspecified: Secondary | ICD-10-CM | POA: Diagnosis not present

## 2023-08-23 DIAGNOSIS — Z6829 Body mass index (BMI) 29.0-29.9, adult: Secondary | ICD-10-CM | POA: Diagnosis not present

## 2023-08-23 DIAGNOSIS — I639 Cerebral infarction, unspecified: Secondary | ICD-10-CM | POA: Diagnosis not present

## 2023-08-23 DIAGNOSIS — M5416 Radiculopathy, lumbar region: Secondary | ICD-10-CM | POA: Diagnosis not present

## 2023-08-23 DIAGNOSIS — M25552 Pain in left hip: Secondary | ICD-10-CM | POA: Diagnosis not present

## 2023-08-23 DIAGNOSIS — R030 Elevated blood-pressure reading, without diagnosis of hypertension: Secondary | ICD-10-CM | POA: Diagnosis not present

## 2023-08-23 DIAGNOSIS — Z79899 Other long term (current) drug therapy: Secondary | ICD-10-CM | POA: Diagnosis not present

## 2023-08-23 DIAGNOSIS — M419 Scoliosis, unspecified: Secondary | ICD-10-CM | POA: Diagnosis not present

## 2023-08-23 DIAGNOSIS — N1831 Chronic kidney disease, stage 3a: Secondary | ICD-10-CM | POA: Diagnosis not present

## 2023-08-23 DIAGNOSIS — M25551 Pain in right hip: Secondary | ICD-10-CM | POA: Diagnosis not present

## 2023-08-23 DIAGNOSIS — M069 Rheumatoid arthritis, unspecified: Secondary | ICD-10-CM | POA: Diagnosis not present

## 2023-08-23 DIAGNOSIS — R892 Abnormal level of other drugs, medicaments and biological substances in specimens from other organs, systems and tissues: Secondary | ICD-10-CM | POA: Diagnosis not present

## 2023-08-29 ENCOUNTER — Ambulatory Visit: Payer: Medicare (Managed Care)

## 2023-08-29 ENCOUNTER — Encounter: Payer: Self-pay | Admitting: Internal Medicine

## 2023-08-29 ENCOUNTER — Ambulatory Visit: Payer: Medicare (Managed Care) | Attending: Internal Medicine | Admitting: Internal Medicine

## 2023-08-29 VITALS — BP 143/93 | HR 103 | Resp 14 | Ht 64.5 in | Wt 171.0 lb

## 2023-08-29 DIAGNOSIS — J449 Chronic obstructive pulmonary disease, unspecified: Secondary | ICD-10-CM

## 2023-08-29 DIAGNOSIS — M5416 Radiculopathy, lumbar region: Secondary | ICD-10-CM | POA: Diagnosis not present

## 2023-08-29 DIAGNOSIS — G5601 Carpal tunnel syndrome, right upper limb: Secondary | ICD-10-CM | POA: Diagnosis not present

## 2023-08-29 DIAGNOSIS — R768 Other specified abnormal immunological findings in serum: Secondary | ICD-10-CM | POA: Insufficient documentation

## 2023-08-29 DIAGNOSIS — M1990 Unspecified osteoarthritis, unspecified site: Secondary | ICD-10-CM

## 2023-08-29 DIAGNOSIS — M7061 Trochanteric bursitis, right hip: Secondary | ICD-10-CM

## 2023-08-29 DIAGNOSIS — E559 Vitamin D deficiency, unspecified: Secondary | ICD-10-CM | POA: Diagnosis not present

## 2023-08-29 NOTE — Progress Notes (Signed)
 Office Visit Note  Patient: Sean Ramos             Date of Birth: February 11, 1956           MRN: [id number]             PCP: Theoplis Fix, MD Referring: Barbaraann Bookbinder, Rosea Conch, * Visit Date: 08/29/2023   Subjective:  New Patient (Initial Visit) (Patient states he has pain pain a lot in his right leg. Patient states there is a lot of pain in his right calve. Patient states they had put something in his back and he does not know if that is the cause. )   Discussed the use of AI scribe software for clinical note transcription with the patient, who gave verbal consent to proceed.  History of Present Illness   Sean Ramos is a 68 year old male here for evaluation of possible rheumatoid arthritis. He was referred by his primary doctor for evaluation of arthritis problems and concerning blood test results for rheumatoid arthritis.  He has a history of degenerative arthritis in his back and has undergone surgery on his hand and elbow for nerve issues, with carpal tunnel syndrome discussed as a possible cause. The surgery did not alleviate his symptoms, and his hands remain numb with occasional involuntary movements. He describes his hand as feeling 'possessed' and mentions that it sometimes closes on its own, requiring him to force it open. No swelling in his hands, but he reports numbness and a 'half frozen' sensation.  Approximately ten years ago, he had a stroke affecting the right side of his body, including his arm and leg. He experiences difficulty walking and performing daily activities due to pain and stiffness, particularly in his leg and hip. The pain is severe enough to 'jump me out of bed' and worsens with certain movements.  He experiences occasional swelling in his feet and ankles, though not consistently related to any specific activity. He also reports tightness and pain in his calf muscle, particularly on the lateral side, which he describes as feeling like a 'rock'.  He  experienced COVID-19 about a month and a half ago, leading to symptoms such as loss of taste and pain. He has a history of COPD and had a portion of his right lung removed due to pneumonia complications. He reports difficulty with physical activity due to breathing issues and mentions that he tries to exercise but is limited by his respiratory condition.  He is not currently taking prednisone  but was previously prescribed a low dose for an illness. He is not aware of being treated for low vitamin D .  He wants to return to work but is limited by pain and mobility issues. Standing or walking for extended periods exacerbates his pain. He uses CBD gummies for pain relief but is concerned about the implications for drug testing and prescription pain medication use.   Labs reviewed 10/2022 RF 93 ESR 50 Uric acid 7.8 ANA neg Vit D 9.28 iPTH 112 HBV/HCV neg    Activities of Daily Living:  Patient reports morning stiffness for 1 hour.   Patient Reports nocturnal pain.  Difficulty dressing/grooming: Reports Difficulty climbing stairs: Reports Difficulty getting out of chair: Denies Difficulty using hands for taps, buttons, cutlery, and/or writing: Reports  Review of Systems  Constitutional:  Positive for fatigue.  HENT:  Positive for mouth sores and mouth dryness.   Eyes:  Positive for dryness.  Respiratory:  Positive for shortness of breath.  Cardiovascular:  Positive for palpitations. Negative for chest pain.  Gastrointestinal:  Negative for blood in stool, constipation and diarrhea.  Endocrine: Negative for increased urination.  Genitourinary:  Positive for involuntary urination.  Musculoskeletal:  Positive for joint pain, gait problem, joint pain, myalgias, muscle weakness, morning stiffness, muscle tenderness and myalgias. Negative for joint swelling.  Skin:  Positive for hair loss. Negative for color change, rash and sensitivity to sunlight.  Allergic/Immunologic: Negative for  susceptible to infections.  Neurological:  Positive for dizziness and headaches.  Hematological:  Negative for swollen glands.  Psychiatric/Behavioral:  Positive for depressed mood and sleep disturbance. The patient is nervous/anxious.     PMFS History:  Patient Active Problem List   Diagnosis Date Noted   Rheumatoid factor positive 08/29/2023   Vitamin D deficiency 08/29/2023   Carpal tunnel syndrome 10/15/2022   Chronic low back pain 10/15/2022   History of decompression of median nerve 10/15/2022   History of decompression of ulnar nerve 10/15/2022   Pain in right arm 10/15/2022   Paresthesia of hand 10/15/2022   Ulnar neuropathy 10/15/2022   DOE (dyspnea on exertion) 08/10/2022   Upper airway cough syndrome 08/10/2022   Arthropathy of lumbar facet joint 06/04/2022   Pelvic floor dysfunction 06/01/2022   Insomnia 06/01/2022   Nocturia 06/01/2022   Snoring 06/01/2022   Neuropathic pain 11/29/2021   Low testosterone  11/23/2021   Lower urinary tract symptoms (LUTS) 11/23/2021   Lumbar degenerative disc disease 10/25/2021   Right hip pain 10/25/2021   Trochanteric bursitis of right hip 10/25/2021   Failed back surgical syndrome 10/11/2021   Chronic use of opiate drug for therapeutic purpose 09/25/2021   Opioid type dependence, continuous (HCC) 09/25/2021   Lumbar foraminal stenosis 09/25/2021   Lumbar radiculopathy 08/08/2021   Other male erectile dysfunction 04/27/2021   Anxiety 12/27/2020   Benign localized prostatic hyperplasia with lower urinary tract symptoms (LUTS) 12/27/2020   History of back pain 12/27/2020   Hyperlipidemia 12/27/2020   Overactive bladder 12/27/2020   Synovial cyst of lumbar facet joint 09/05/2020   Condyloma acuminatum in male 12/19/2016   Serrated adenoma of colon 12/19/2016   Tubular adenoma of colon 12/19/2016   Morbid obesity due to excess calories (HCC) 09/01/2015   CARCINOMA, SQUAMOUS CELL 04/28/2010   Cigarette smoker 04/28/2010    OBSTRUCTIVE SLEEP APNEA 04/28/2010   Essential hypertension 04/28/2010   COPD GOLD III   04/28/2010    Past Medical History:  Diagnosis Date   Anxiety    Arthritis    Asthma    Cancer (HCC)    skin cancer on right thumb   COPD (chronic obstructive pulmonary disease) (HCC)    Depression    Hyperlipidemia    Hypertension    Insomnia    Pneumonia    1975   Stroke (HCC)     Family History  Problem Relation Age of Onset   Heart attack Father    Stroke Brother    Stroke Brother    Past Surgical History:  Procedure Laterality Date   APPENDECTOMY     EYE SURGERY     cataracts bilateral   LUMBAR LAMINECTOMY/DECOMPRESSION MICRODISCECTOMY N/A 09/05/2020   Procedure: LAMINECTOMY LUMBAR FIVE  - SACRAL ONE;  Surgeon: Garry Kansas, MD;  Location: [facility] OR;  Service: Neurosurgery;  Laterality: N/A;   MOHS SURGERY Right 2002   rt hand   PROSTATE SURGERY     removal of a growth   Social History   Social History Narrative  Not on file   Immunization History  Administered Date(s) Administered   Fluad Quad(high Dose 65+) 12/31/2020   Influenza, High Dose Seasonal PF 01/17/2020   Moderna Sars-Covid-2 Vaccination 06/11/2019, 07/14/2019   Pneumococcal Conjugate-13 01/17/2020     Objective: Vital Signs: BP (!) 143/93 (BP Location: Left Arm, Patient Position: Sitting, Cuff Size: Normal)   Pulse (!) 103   Resp 14   Ht 5' 4.5" (1.638 m)   Wt 171 lb (77.6 kg)   BMI 28.90 kg/m    Physical Exam Eyes:     Conjunctiva/sclera: Conjunctivae normal.  Cardiovascular:     Rate and Rhythm: Normal rate and regular rhythm.  Pulmonary:     Effort: Pulmonary effort is normal.     Breath sounds: Normal breath sounds.  Lymphadenopathy:     Cervical: No cervical adenopathy.  Skin:    General: Skin is warm and dry.  Neurological:     Mental Status: He is alert.  Psychiatric:        Mood and Affect: Mood normal.      Musculoskeletal Exam:  Shoulders full ROM no tenderness or  swelling Elbows full ROM no tenderness or swelling Wrists full ROM no tenderness or swelling Fingers full ROM no tenderness or swelling Hardware implanted under skin on left low back, tenderness to pressure across low back along iliac crest and over greater trochanter b/l. Some tenderness and very tight muscle tone along lateral thigh/IT band and lateral body of gastroc Limited hip internal rotation worse on right leg Knees full ROM no tenderness or swelling   Investigation: No additional findings.  Imaging: No results found.  Recent Labs: Lab Results  Component Value Date   WBC 6.3 08/10/2022   HGB 12.9 (L) 08/10/2022   PLT 202 08/10/2022   NA 143 08/10/2022   K 4.2 08/10/2022   CL 103 08/10/2022   CO2 23 08/10/2022   GLUCOSE 103 (H) 08/10/2022   BUN 18 08/10/2022   CREATININE 1.31 (H) 08/10/2022   CALCIUM 9.4 08/10/2022    Speciality Comments: No specialty comments available.  Procedures:  No procedures performed Allergies: Patient has no known allergies.   Assessment / Plan:     Visit Diagnoses: Rheumatoid factor positive - Plan: XR Hand 2 View Right, XR Hand 2 View Left, Rheumatoid factor, Cyclic citrul peptide antibody, IgG, Sedimentation rate, C-reactive protein Positive rheumatoid factor and elevated sedimentation rate suggest possible rheumatoid arthritis. He does not have any overt synovitis appreciable on exam or by limited MSKUS exam of the hands. Primary complaint at this time about low back, hip, and leg pain that is not typical for RA. Hand pain may be more consistent, although describing a distribution in fingertips of right hand could be more nerve related than joints. - Order blood test to recheck rheumatoid arthritis markers. - B/L hand xrays for degenerative or inflammatory changes - Consider hydroxychloroquine if further tests confirm rheumatoid arthritis. - Follow up in 2-3 months to assess treatment effectiveness.  Osteoarthritis Osteoarthritis with  DDD also some mild chronic bony changes peripherally. Discussed long term pain management briefly, has been doing okay had recent hiccup with UDS result after using CBD gummy for arthritis. - Order x-ray of hands to assess bone spurs and joint condition. - Provide exercises targeting hip and IT band area to improve flexibility and reduce pain.  Degenerative disc disease Degenerative disc disease causing back pain and sciatica due to nerve compression in spine.  Vitamin D  deficiency Severe vitamin D  deficiency with  level of 9, leading to elevated parathyroid hormone levels. It is not clear I this has been or remains corrected right now. Would be a cause of joint problems at this deficient level. - Order blood test to recheck vitamin D  levels. - Prescribe high-dose vitamin D  replacement for 3 months if levels remain low.  Chronic obstructive pulmonary disease (COPD) COPD with limitations on physical activity due to breathing difficulties. Lung resection Lung resection due to pneumonia. No current respiratory issues related to resection.        Orders: Orders Placed This Encounter  Procedures   XR Hand 2 View Right   XR Hand 2 View Left   Rheumatoid factor   Cyclic citrul peptide antibody, IgG   Sedimentation rate   C-reactive protein   VITAMIN D  25 Hydroxy (Vit-D Deficiency, Fractures)   No orders of the defined types were placed in this encounter.    Follow-Up Instructions: Return in about 3 months (around 11/29/2023) for New pt ?RA HCQ f/u 3mos.   Matt Song, MD  Note - This record has been created using [employer].  Chart creation errors have been sought, but may not always  have been located. Such creation errors do not reflect on  the standard of medical care.

## 2023-08-30 ENCOUNTER — Telehealth: Payer: Self-pay

## 2023-08-30 NOTE — Telephone Encounter (Signed)
 Patient contacted the office and states he had labs and xrays done at his appointment yesterday and thought Dr. Rodell Citrin was going to send in medication for him. Advised the patient he just had them yesterday and Dr. Rodell Citrin has has not had the chance to review all of the results yet. Advised the patient I would still send a message to Dr. Rodell Citrin due to his follow up being farther out. Patient states the medications he was talking about was Hydroxychloroquine and Vitamin D . Patient states if Dr. Rodell Citrin can send any prescriptions, he would like them to go to [facility].

## 2023-08-31 LAB — CYCLIC CITRUL PEPTIDE ANTIBODY, IGG: Cyclic Citrullin Peptide Ab: <16 U

## 2023-08-31 LAB — C-REACTIVE PROTEIN: CRP: <3 mg/L (ref <8.0)

## 2023-08-31 LAB — SEDIMENTATION RATE: Sed Rate: 9 mm/h (ref 0–20)

## 2023-08-31 LAB — VITAMIN D 25 HYDROXY (VIT D DEFICIENCY, FRACTURES): Vit D, 25-Hydroxy: 15 ng/mL — ABNORMAL LOW (ref 30–100)

## 2023-08-31 LAB — RHEUMATOID FACTOR: Rheumatoid fact SerPl-aCnc: 173 [IU]/mL — ABNORMAL HIGH (ref <14)

## 2023-09-02 ENCOUNTER — Other Ambulatory Visit: Payer: Self-pay

## 2023-09-02 DIAGNOSIS — Z87891 Personal history of nicotine dependence: Secondary | ICD-10-CM

## 2023-09-02 DIAGNOSIS — Z122 Encounter for screening for malignant neoplasm of respiratory organs: Secondary | ICD-10-CM

## 2023-09-05 DIAGNOSIS — N1831 Chronic kidney disease, stage 3a: Secondary | ICD-10-CM | POA: Diagnosis not present

## 2023-09-05 DIAGNOSIS — M419 Scoliosis, unspecified: Secondary | ICD-10-CM | POA: Diagnosis not present

## 2023-09-05 DIAGNOSIS — I639 Cerebral infarction, unspecified: Secondary | ICD-10-CM | POA: Diagnosis not present

## 2023-09-05 DIAGNOSIS — M5416 Radiculopathy, lumbar region: Secondary | ICD-10-CM | POA: Diagnosis not present

## 2023-09-05 DIAGNOSIS — Z79899 Other long term (current) drug therapy: Secondary | ICD-10-CM | POA: Diagnosis not present

## 2023-09-05 DIAGNOSIS — R892 Abnormal level of other drugs, medicaments and biological substances in specimens from other organs, systems and tissues: Secondary | ICD-10-CM | POA: Diagnosis not present

## 2023-09-05 DIAGNOSIS — M25551 Pain in right hip: Secondary | ICD-10-CM | POA: Diagnosis not present

## 2023-09-05 DIAGNOSIS — M25552 Pain in left hip: Secondary | ICD-10-CM | POA: Diagnosis not present

## 2023-09-19 ENCOUNTER — Ambulatory Visit (HOSPITAL_COMMUNITY): Admission: RE | Admit: 2023-09-19 | Payer: Medicare (Managed Care) | Source: Ambulatory Visit

## 2023-09-19 DIAGNOSIS — I639 Cerebral infarction, unspecified: Secondary | ICD-10-CM | POA: Diagnosis not present

## 2023-09-19 DIAGNOSIS — M25552 Pain in left hip: Secondary | ICD-10-CM | POA: Diagnosis not present

## 2023-09-19 DIAGNOSIS — M419 Scoliosis, unspecified: Secondary | ICD-10-CM | POA: Diagnosis not present

## 2023-09-19 DIAGNOSIS — M25551 Pain in right hip: Secondary | ICD-10-CM | POA: Diagnosis not present

## 2023-09-19 DIAGNOSIS — Z79899 Other long term (current) drug therapy: Secondary | ICD-10-CM | POA: Diagnosis not present

## 2023-09-19 DIAGNOSIS — N1831 Chronic kidney disease, stage 3a: Secondary | ICD-10-CM | POA: Diagnosis not present

## 2023-09-19 DIAGNOSIS — M5416 Radiculopathy, lumbar region: Secondary | ICD-10-CM | POA: Diagnosis not present

## 2023-09-25 DIAGNOSIS — M5416 Radiculopathy, lumbar region: Secondary | ICD-10-CM | POA: Diagnosis not present

## 2023-09-25 DIAGNOSIS — N1831 Chronic kidney disease, stage 3a: Secondary | ICD-10-CM | POA: Diagnosis not present

## 2023-09-25 DIAGNOSIS — R030 Elevated blood-pressure reading, without diagnosis of hypertension: Secondary | ICD-10-CM | POA: Diagnosis not present

## 2023-09-25 DIAGNOSIS — M419 Scoliosis, unspecified: Secondary | ICD-10-CM | POA: Diagnosis not present

## 2023-09-25 DIAGNOSIS — I639 Cerebral infarction, unspecified: Secondary | ICD-10-CM | POA: Diagnosis not present

## 2023-09-25 DIAGNOSIS — M25551 Pain in right hip: Secondary | ICD-10-CM | POA: Diagnosis not present

## 2023-09-25 DIAGNOSIS — Z6826 Body mass index (BMI) 26.0-26.9, adult: Secondary | ICD-10-CM | POA: Diagnosis not present

## 2023-09-25 DIAGNOSIS — M25552 Pain in left hip: Secondary | ICD-10-CM | POA: Diagnosis not present

## 2023-09-25 DIAGNOSIS — Z79899 Other long term (current) drug therapy: Secondary | ICD-10-CM | POA: Diagnosis not present

## 2023-09-25 DIAGNOSIS — Z789 Other specified health status: Secondary | ICD-10-CM | POA: Diagnosis not present

## 2023-11-26 ENCOUNTER — Encounter: Payer: Self-pay | Admitting: Acute Care

## 2023-12-03 ENCOUNTER — Ambulatory Visit: Payer: Medicare (Managed Care) | Admitting: Internal Medicine

## 2023-12-03 NOTE — Progress Notes (Deleted)
 Office Visit Note  Patient: Sean Ramos             Date of Birth: [birth year]           MRN: [id number]             PCP: Maree Isles, MD Referring: Maree Isles, MD Visit Date: 12/03/2023   Subjective:  No chief complaint on file.   History of Present Illness: Sean Ramos is a 68 y.o. male here for follow up ***   Previous HPI 08/29/23 Sean Ramos is a 68 year old male here for evaluation of possible rheumatoid arthritis. He was referred by his primary doctor for evaluation of arthritis problems and concerning blood test results for rheumatoid arthritis.   He has a history of degenerative arthritis in his back and has undergone surgery on his hand and elbow for nerve issues, with carpal tunnel syndrome discussed as a possible cause. The surgery did not alleviate his symptoms, and his hands remain numb with occasional involuntary movements. He describes his hand as feeling 'possessed' and mentions that it sometimes closes on its own, requiring him to force it open. No swelling in his hands, but he reports numbness and a 'half frozen' sensation.   Approximately ten years ago, he had a stroke affecting the right side of his body, including his arm and leg. He experiences difficulty walking and performing daily activities due to pain and stiffness, particularly in his leg and hip. The pain is severe enough to 'jump me out of bed' and worsens with certain movements.   He experiences occasional swelling in his feet and ankles, though not consistently related to any specific activity. He also reports tightness and pain in his calf muscle, particularly on the lateral side, which he describes as feeling like a 'rock'.   He experienced COVID-19 about a month and a half ago, leading to symptoms such as loss of taste and pain. He has a history of COPD and had a portion of his right lung removed due to pneumonia complications. He reports difficulty with physical activity due to  breathing issues and mentions that he tries to exercise but is limited by his respiratory condition.   He is not currently taking prednisone  but was previously prescribed a low dose for an illness. He is not aware of being treated for low vitamin D .   He wants to return to work but is limited by pain and mobility issues. Standing or walking for extended periods exacerbates his pain. He uses CBD gummies for pain relief but is concerned about the implications for drug testing and prescription pain medication use.    Labs reviewed 10/2022 RF 93 ESR 50 Uric acid 7.8 ANA neg Vit D 9.28 iPTH 112 HBV/HCV neg   No Rheumatology ROS completed.   PMFS History:  Patient Active Problem List   Diagnosis Date Noted   Rheumatoid factor positive 08/29/2023   Vitamin D  deficiency 08/29/2023   Carpal tunnel syndrome 10/15/2022   Chronic low back pain 10/15/2022   History of decompression of median nerve 10/15/2022   History of decompression of ulnar nerve 10/15/2022   Pain in right arm 10/15/2022   Paresthesia of hand 10/15/2022   Ulnar neuropathy 10/15/2022   DOE (dyspnea on exertion) 08/10/2022   Upper airway cough syndrome 08/10/2022   Arthropathy of lumbar facet joint 06/04/2022   Pelvic floor dysfunction 06/01/2022   Insomnia 06/01/2022   Nocturia 06/01/2022   Snoring 06/01/2022   Neuropathic  pain 11/29/2021   Low testosterone  11/23/2021   Lower urinary tract symptoms (LUTS) 11/23/2021   Lumbar degenerative disc disease 10/25/2021   Right hip pain 10/25/2021   Trochanteric bursitis of right hip 10/25/2021   Failed back surgical syndrome 10/11/2021   Chronic use of opiate drug for therapeutic purpose 09/25/2021   Opioid type dependence, continuous (HCC) 09/25/2021   Lumbar foraminal stenosis 09/25/2021   Lumbar radiculopathy 08/08/2021   Other male erectile dysfunction 04/27/2021   Anxiety 12/27/2020   Benign localized prostatic hyperplasia with lower urinary tract symptoms (LUTS)  12/27/2020   History of back pain 12/27/2020   Hyperlipidemia 12/27/2020   Overactive bladder 12/27/2020   Synovial cyst of lumbar facet joint 09/05/2020   Condyloma acuminatum in male 12/19/2016   Serrated adenoma of colon 12/19/2016   Tubular adenoma of colon 12/19/2016   Morbid obesity due to excess calories (HCC) 09/01/2015   CARCINOMA, SQUAMOUS CELL 04/28/2010   Cigarette smoker 04/28/2010   OBSTRUCTIVE SLEEP APNEA 04/28/2010   Essential hypertension 04/28/2010   COPD GOLD III   04/28/2010    Past Medical History:  Diagnosis Date   Anxiety    Arthritis    Asthma    Cancer (HCC)    skin cancer on right thumb   COPD (chronic obstructive pulmonary disease) (HCC)    Depression    Hyperlipidemia    Hypertension    Insomnia    Pneumonia    1975   Stroke (HCC)     Family History  Problem Relation Age of Onset   Heart attack Father    Stroke Brother    Stroke Brother    Past Surgical History:  Procedure Laterality Date   APPENDECTOMY     EYE SURGERY     cataracts bilateral   LUMBAR LAMINECTOMY/DECOMPRESSION MICRODISCECTOMY N/A 09/05/2020   Procedure: LAMINECTOMY LUMBAR FIVE  - SACRAL ONE;  Surgeon: Mavis Purchase, MD;  Location: [facility] OR;  Service: Neurosurgery;  Laterality: N/A;   MOHS SURGERY Right 2002   rt hand   PROSTATE SURGERY     removal of a growth   Social History   Social History Narrative   Not on file   Immunization History  Administered Date(s) Administered   Fluad Quad(high Dose 65+) 12/31/2020   INFLUENZA, HIGH DOSE SEASONAL PF 01/17/2020   Moderna Sars-Covid-2 Vaccination 06/11/2019, 07/14/2019   Pneumococcal Conjugate-13 01/17/2020     Objective: Vital Signs: There were no vitals taken for this visit.   Physical Exam   Musculoskeletal Exam: ***  CDAI Exam: CDAI Score: -- Patient Global: --; Provider Global: -- Swollen: --; Tender: -- Joint Exam 12/03/2023   No joint exam has been documented for this visit   There is currently  no information documented on the homunculus. Go to the Rheumatology activity and complete the homunculus joint exam.  Investigation: No additional findings.  Imaging: No results found.  Recent Labs: Lab Results  Component Value Date   WBC 6.3 08/10/2022   HGB 12.9 (L) 08/10/2022   PLT 202 08/10/2022   NA 143 08/10/2022   K 4.2 08/10/2022   CL 103 08/10/2022   CO2 23 08/10/2022   GLUCOSE 103 (H) 08/10/2022   BUN 18 08/10/2022   CREATININE 1.31 (H) 08/10/2022   CALCIUM 9.5 10/26/2022    Speciality Comments: No specialty comments available.  Procedures:  No procedures performed Allergies: Patient has no known allergies.   Assessment / Plan:     Visit Diagnoses: No diagnosis found.  ***  Orders: No orders of the defined types were placed in this encounter.  No orders of the defined types were placed in this encounter.    Follow-Up Instructions: No follow-ups on file.   Lonni LELON Ester, MD  Note - This record has been created using [employer].  Chart creation errors have been sought, but may not always  have been located. Such creation errors do not reflect on  the standard of medical care.

## 2024-01-22 ENCOUNTER — Ambulatory Visit: Payer: Self-pay | Admitting: Internal Medicine

## 2024-01-28 ENCOUNTER — Ambulatory Visit: Admitting: Internal Medicine

## 2024-01-28 NOTE — Progress Notes (Deleted)
 Office Visit Note  Patient: Sean Ramos             Date of Birth: [birth year]           MRN: [id number]             PCP: Maree Isles, MD Referring: Maree Isles, MD Visit Date: 01/28/2024   Subjective:  No chief complaint on file.   History of Present Illness: Sean Ramos is a 68 y.o. male here for follow up ***   Previous HPI 08/29/23 Sean Ramos is a 68 year old male here for evaluation of possible rheumatoid arthritis. He was referred by his primary doctor for evaluation of arthritis problems and concerning blood test results for rheumatoid arthritis.   He has a history of degenerative arthritis in his back and has undergone surgery on his hand and elbow for nerve issues, with carpal tunnel syndrome discussed as a possible cause. The surgery did not alleviate his symptoms, and his hands remain numb with occasional involuntary movements. He describes his hand as feeling 'possessed' and mentions that it sometimes closes on its own, requiring him to force it open. No swelling in his hands, but he reports numbness and a 'half frozen' sensation.   Approximately ten years ago, he had a stroke affecting the right side of his body, including his arm and leg. He experiences difficulty walking and performing daily activities due to pain and stiffness, particularly in his leg and hip. The pain is severe enough to 'jump me out of bed' and worsens with certain movements.   He experiences occasional swelling in his feet and ankles, though not consistently related to any specific activity. He also reports tightness and pain in his calf muscle, particularly on the lateral side, which he describes as feeling like a 'rock'.   He experienced COVID-19 about a month and a half ago, leading to symptoms such as loss of taste and pain. He has a history of COPD and had a portion of his right lung removed due to pneumonia complications. He reports difficulty with physical activity due to  breathing issues and mentions that he tries to exercise but is limited by his respiratory condition.   He is not currently taking prednisone  but was previously prescribed a low dose for an illness. He is not aware of being treated for low vitamin D .   He wants to return to work but is limited by pain and mobility issues. Standing or walking for extended periods exacerbates his pain. He uses CBD gummies for pain relief but is concerned about the implications for drug testing and prescription pain medication use.    Labs reviewed 10/2022 RF 93 ESR 50 Uric acid 7.8 ANA neg Vit D 9.28 iPTH 112 HBV/HCV neg     No Rheumatology ROS completed.   PMFS History:  Patient Active Problem List   Diagnosis Date Noted   Rheumatoid factor positive 08/29/2023   Vitamin D  deficiency 08/29/2023   Carpal tunnel syndrome 10/15/2022   Chronic low back pain 10/15/2022   History of decompression of median nerve 10/15/2022   History of decompression of ulnar nerve 10/15/2022   Pain in right arm 10/15/2022   Paresthesia of hand 10/15/2022   Ulnar neuropathy 10/15/2022   DOE (dyspnea on exertion) 08/10/2022   Upper airway cough syndrome 08/10/2022   Arthropathy of lumbar facet joint 06/04/2022   Pelvic floor dysfunction 06/01/2022   Insomnia 06/01/2022   Nocturia 06/01/2022   Snoring 06/01/2022  Neuropathic pain 11/29/2021   Low testosterone  11/23/2021   Lower urinary tract symptoms (LUTS) 11/23/2021   Lumbar degenerative disc disease 10/25/2021   Right hip pain 10/25/2021   Trochanteric bursitis of right hip 10/25/2021   Failed back surgical syndrome 10/11/2021   Chronic use of opiate drug for therapeutic purpose 09/25/2021   Opioid type dependence, continuous (HCC) 09/25/2021   Lumbar foraminal stenosis 09/25/2021   Lumbar radiculopathy 08/08/2021   Other male erectile dysfunction 04/27/2021   Anxiety 12/27/2020   Benign localized prostatic hyperplasia with lower urinary tract symptoms  (LUTS) 12/27/2020   History of back pain 12/27/2020   Hyperlipidemia 12/27/2020   Overactive bladder 12/27/2020   Synovial cyst of lumbar facet joint 09/05/2020   Condyloma acuminatum in male 12/19/2016   Serrated adenoma of colon 12/19/2016   Tubular adenoma of colon 12/19/2016   Morbid obesity due to excess calories (HCC) 09/01/2015   CARCINOMA, SQUAMOUS CELL 04/28/2010   Cigarette smoker 04/28/2010   OBSTRUCTIVE SLEEP APNEA 04/28/2010   Essential hypertension 04/28/2010   COPD GOLD III   04/28/2010    Past Medical History:  Diagnosis Date   Anxiety    Arthritis    Asthma    Cancer (HCC)    skin cancer on right thumb   COPD (chronic obstructive pulmonary disease) (HCC)    Depression    Hyperlipidemia    Hypertension    Insomnia    Pneumonia    1975   Stroke (HCC)     Family History  Problem Relation Age of Onset   Heart attack Father    Stroke Brother    Stroke Brother    Past Surgical History:  Procedure Laterality Date   APPENDECTOMY     EYE SURGERY     cataracts bilateral   LUMBAR LAMINECTOMY/DECOMPRESSION MICRODISCECTOMY N/A 09/05/2020   Procedure: LAMINECTOMY LUMBAR FIVE  - SACRAL ONE;  Surgeon: Mavis Purchase, MD;  Location: [facility] OR;  Service: Neurosurgery;  Laterality: N/A;   MOHS SURGERY Right 2002   rt hand   PROSTATE SURGERY     removal of a growth   Social History   Social History Narrative   Not on file   Immunization History  Administered Date(s) Administered   Fluad Quad(high Dose 65+) 12/31/2020   INFLUENZA, HIGH DOSE SEASONAL PF 01/17/2020   Moderna Sars-Covid-2 Vaccination 06/11/2019, 07/14/2019   Pneumococcal Conjugate-13 01/17/2020     Objective: Vital Signs: There were no vitals taken for this visit.   Physical Exam   Musculoskeletal Exam: ***  CDAI Exam: CDAI Score: -- Patient Global: --; Provider Global: -- Swollen: --; Tender: -- Joint Exam 01/28/2024   No joint exam has been documented for this visit   There is  currently no information documented on the homunculus. Go to the Rheumatology activity and complete the homunculus joint exam.  Investigation: No additional findings.  Imaging: No results found.  Recent Labs: Lab Results  Component Value Date   WBC 6.3 08/10/2022   HGB 12.9 (L) 08/10/2022   PLT 202 08/10/2022   NA 143 08/10/2022   K 4.2 08/10/2022   CL 103 08/10/2022   CO2 23 08/10/2022   GLUCOSE 103 (H) 08/10/2022   BUN 18 08/10/2022   CREATININE 1.31 (H) 08/10/2022   CALCIUM 9.5 10/26/2022    Speciality Comments: No specialty comments available.  Procedures:  No procedures performed Allergies: Patient has no known allergies.   Assessment / Plan:     Visit Diagnoses: No diagnosis found.  ***  Orders: No orders of the defined types were placed in this encounter.  No orders of the defined types were placed in this encounter.    Follow-Up Instructions: No follow-ups on file.   Lonni LELON Ester, MD  Note - This record has been created using [employer].  Chart creation errors have been sought, but may not always  have been located. Such creation errors do not reflect on  the standard of medical care.

## 2024-04-20 ENCOUNTER — Ambulatory Visit: Admitting: Internal Medicine

## 2024-04-20 NOTE — Progress Notes (Unsigned)
 "  Office Visit Note  Patient: Sean Ramos             Date of Birth: Jun 29, 1955           MRN: [id number]             PCP: Maree Isles, MD Referring: Maree Isles, MD Visit Date: 04/20/2024   Subjective:  No chief complaint on file.   History of Present Illness: Sean Ramos is a 69 y.o. male here for follow up ***   Previous HPI 08/29/23 Sean Ramos is a 69 year old male here for evaluation of possible rheumatoid arthritis. He was referred by his primary doctor for evaluation of arthritis problems and concerning blood test results for rheumatoid arthritis.   He has a history of degenerative arthritis in his back and has undergone surgery on his hand and elbow for nerve issues, with carpal tunnel syndrome discussed as a possible cause. The surgery did not alleviate his symptoms, and his hands remain numb with occasional involuntary movements. He describes his hand as feeling 'possessed' and mentions that it sometimes closes on its own, requiring him to force it open. No swelling in his hands, but he reports numbness and a 'half frozen' sensation.   Approximately ten years ago, he had a stroke affecting the right side of his body, including his arm and leg. He experiences difficulty walking and performing daily activities due to pain and stiffness, particularly in his leg and hip. The pain is severe enough to 'jump me out of bed' and worsens with certain movements.   He experiences occasional swelling in his feet and ankles, though not consistently related to any specific activity. He also reports tightness and pain in his calf muscle, particularly on the lateral side, which he describes as feeling like a 'rock'.   He experienced COVID-19 about a month and a half ago, leading to symptoms such as loss of taste and pain. He has a history of COPD and had a portion of his right lung removed due to pneumonia complications. He reports difficulty with physical activity due to  breathing issues and mentions that he tries to exercise but is limited by his respiratory condition.   He is not currently taking prednisone  but was previously prescribed a low dose for an illness. He is not aware of being treated for low vitamin D .   He wants to return to work but is limited by pain and mobility issues. Standing or walking for extended periods exacerbates his pain. He uses CBD gummies for pain relief but is concerned about the implications for drug testing and prescription pain medication use.    Labs reviewed 10/2022 RF 93 ESR 50 Uric acid 7.8 ANA neg Vit D 9.28 iPTH 112 HBV/HCV neg   No Rheumatology ROS completed.   PMFS History:  Patient Active Problem List   Diagnosis Date Noted   Rheumatoid factor positive 08/29/2023   Vitamin D  deficiency 08/29/2023   Carpal tunnel syndrome 10/15/2022   Chronic low back pain 10/15/2022   History of decompression of median nerve 10/15/2022   History of decompression of ulnar nerve 10/15/2022   Pain in right arm 10/15/2022   Paresthesia of hand 10/15/2022   Ulnar neuropathy 10/15/2022   DOE (dyspnea on exertion) 08/10/2022   Upper airway cough syndrome 08/10/2022   Arthropathy of lumbar facet joint 06/04/2022   Pelvic floor dysfunction 06/01/2022   Insomnia 06/01/2022   Nocturia 06/01/2022   Snoring 06/01/2022  Neuropathic pain 11/29/2021   Low testosterone  11/23/2021   Lower urinary tract symptoms (LUTS) 11/23/2021   Lumbar degenerative disc disease 10/25/2021   Right hip pain 10/25/2021   Trochanteric bursitis of right hip 10/25/2021   Failed back surgical syndrome 10/11/2021   Chronic use of opiate drug for therapeutic purpose 09/25/2021   Opioid type dependence, continuous (HCC) 09/25/2021   Lumbar foraminal stenosis 09/25/2021   Lumbar radiculopathy 08/08/2021   Other male erectile dysfunction 04/27/2021   Anxiety 12/27/2020   Benign localized prostatic hyperplasia with lower urinary tract symptoms (LUTS)  12/27/2020   History of back pain 12/27/2020   Hyperlipidemia 12/27/2020   Overactive bladder 12/27/2020   Synovial cyst of lumbar facet joint 09/05/2020   Condyloma acuminatum in male 12/19/2016   Serrated adenoma of colon 12/19/2016   Tubular adenoma of colon 12/19/2016   Morbid obesity due to excess calories (HCC) 09/01/2015   CARCINOMA, SQUAMOUS CELL 04/28/2010   Cigarette smoker 04/28/2010   OBSTRUCTIVE SLEEP APNEA 04/28/2010   Essential hypertension 04/28/2010   COPD GOLD III   04/28/2010    Past Medical History:  Diagnosis Date   Anxiety    Arthritis    Asthma    Cancer (HCC)    skin cancer on right thumb   COPD (chronic obstructive pulmonary disease) (HCC)    Depression    Hyperlipidemia    Hypertension    Insomnia    Pneumonia    1975   Stroke (HCC)     Family History  Problem Relation Age of Onset   Heart attack Father    Stroke Brother    Stroke Brother    Past Surgical History:  Procedure Laterality Date   APPENDECTOMY     EYE SURGERY     cataracts bilateral   LUMBAR LAMINECTOMY/DECOMPRESSION MICRODISCECTOMY N/A 09/05/2020   Procedure: LAMINECTOMY LUMBAR FIVE  - SACRAL ONE;  Surgeon: Mavis Purchase, MD;  Location: [facility] OR;  Service: Neurosurgery;  Laterality: N/A;   MOHS SURGERY Right 2002   rt hand   PROSTATE SURGERY     removal of a growth   Social History   Social History Narrative   Not on file   Immunization History  Administered Date(s) Administered   Fluad Quad(high Dose 65+) 12/31/2020   INFLUENZA, HIGH DOSE SEASONAL PF 01/17/2020   Moderna Sars-Covid-2 Vaccination 06/11/2019, 07/14/2019   Pneumococcal Conjugate-13 01/17/2020     Objective: Vital Signs: There were no vitals taken for this visit.   Physical Exam   Musculoskeletal Exam: ***   Investigation: No additional findings.  Imaging: No results found.  Recent Labs: Lab Results  Component Value Date   WBC 6.3 08/10/2022   HGB 12.9 (L) 08/10/2022   PLT 202  08/10/2022   NA 143 08/10/2022   K 4.2 08/10/2022   CL 103 08/10/2022   CO2 23 08/10/2022   GLUCOSE 103 (H) 08/10/2022   BUN 18 08/10/2022   CREATININE 1.31 (H) 08/10/2022   CALCIUM 9.5 10/26/2022    Speciality Comments: No specialty comments available.  Procedures:  No procedures performed Allergies: Patient has no known allergies.   Assessment / Plan:     Visit Diagnoses:  Assessment & Plan   ***  Follow-Up Instructions: No follow-ups on file.   Lonni LELON Ester, MD  Note - This record has been created using [employer].  Chart creation errors have been sought, but may not always  have been located. Such creation errors do not reflect on  the standard  of medical care. "
